# Patient Record
Sex: Male | Born: 1985 | Race: White | Hispanic: No | Marital: Married | Smoking: Former smoker
Health system: Southern US, Community
[De-identification: ages and names within clinical notes are randomized; demographics above are authoritative.]

## PROBLEM LIST (undated history)

## (undated) DIAGNOSIS — I10 Essential (primary) hypertension: Secondary | ICD-10-CM

## (undated) DIAGNOSIS — E782 Mixed hyperlipidemia: Secondary | ICD-10-CM

## (undated) DIAGNOSIS — F32A Depression, unspecified: Secondary | ICD-10-CM

## (undated) DIAGNOSIS — G4730 Sleep apnea, unspecified: Secondary | ICD-10-CM

## (undated) DIAGNOSIS — F419 Anxiety disorder, unspecified: Secondary | ICD-10-CM

## (undated) DIAGNOSIS — K219 Gastro-esophageal reflux disease without esophagitis: Secondary | ICD-10-CM

## (undated) DIAGNOSIS — E559 Vitamin D deficiency, unspecified: Secondary | ICD-10-CM

## (undated) DIAGNOSIS — T7840XA Allergy, unspecified, initial encounter: Secondary | ICD-10-CM

## (undated) DIAGNOSIS — E785 Hyperlipidemia, unspecified: Secondary | ICD-10-CM

## (undated) HISTORY — DX: Anxiety disorder, unspecified: F41.9

## (undated) HISTORY — PX: HERNIA REPAIR: SHX51

## (undated) HISTORY — DX: Mixed hyperlipidemia: E78.2

## (undated) HISTORY — DX: Sleep apnea, unspecified: G47.30

## (undated) HISTORY — DX: Gastro-esophageal reflux disease without esophagitis: K21.9

## (undated) HISTORY — DX: Allergy, unspecified, initial encounter: T78.40XA

## (undated) HISTORY — DX: Hyperlipidemia, unspecified: E78.5

## (undated) HISTORY — DX: Essential (primary) hypertension: I10

## (undated) HISTORY — DX: Depression, unspecified: F32.A

## (undated) HISTORY — DX: Vitamin D deficiency, unspecified: E55.9

---

## 2006-01-02 HISTORY — PX: CHOLECYSTECTOMY: SHX55

## 2006-01-05 ENCOUNTER — Emergency Department (HOSPITAL_COMMUNITY): Admission: EM | Admit: 2006-01-05 | Discharge: 2006-01-05 | Payer: Self-pay | Admitting: Emergency Medicine

## 2007-10-05 ENCOUNTER — Emergency Department (HOSPITAL_COMMUNITY): Admission: EM | Admit: 2007-10-05 | Discharge: 2007-10-05 | Payer: Self-pay | Admitting: Emergency Medicine

## 2008-05-24 ENCOUNTER — Emergency Department (HOSPITAL_COMMUNITY): Admission: EM | Admit: 2008-05-24 | Discharge: 2008-05-24 | Payer: Self-pay | Admitting: Family Medicine

## 2008-05-27 ENCOUNTER — Emergency Department (HOSPITAL_COMMUNITY): Admission: EM | Admit: 2008-05-27 | Discharge: 2008-05-27 | Payer: Self-pay | Admitting: Family Medicine

## 2009-03-01 ENCOUNTER — Emergency Department (HOSPITAL_COMMUNITY): Admission: EM | Admit: 2009-03-01 | Discharge: 2009-03-01 | Payer: Self-pay | Admitting: Family Medicine

## 2009-03-04 ENCOUNTER — Emergency Department (HOSPITAL_COMMUNITY): Admission: EM | Admit: 2009-03-04 | Discharge: 2009-03-04 | Payer: Self-pay | Admitting: Family Medicine

## 2009-04-27 ENCOUNTER — Emergency Department (HOSPITAL_COMMUNITY): Admission: EM | Admit: 2009-04-27 | Discharge: 2009-04-27 | Payer: Self-pay | Admitting: Family Medicine

## 2010-03-23 LAB — POCT RAPID STREP A (OFFICE): Streptococcus, Group A Screen (Direct): NEGATIVE

## 2010-04-12 LAB — POCT RAPID STREP A (OFFICE): Streptococcus, Group A Screen (Direct): NEGATIVE

## 2010-06-20 IMAGING — CR DG CHEST 2V
4 series · 4 of 4 positions shown · non-contrast
Comparison: None

CLINICAL DATA: Cough and cold symptoms

CHEST - 2 VIEW

[view not recorded (1 of 4)]
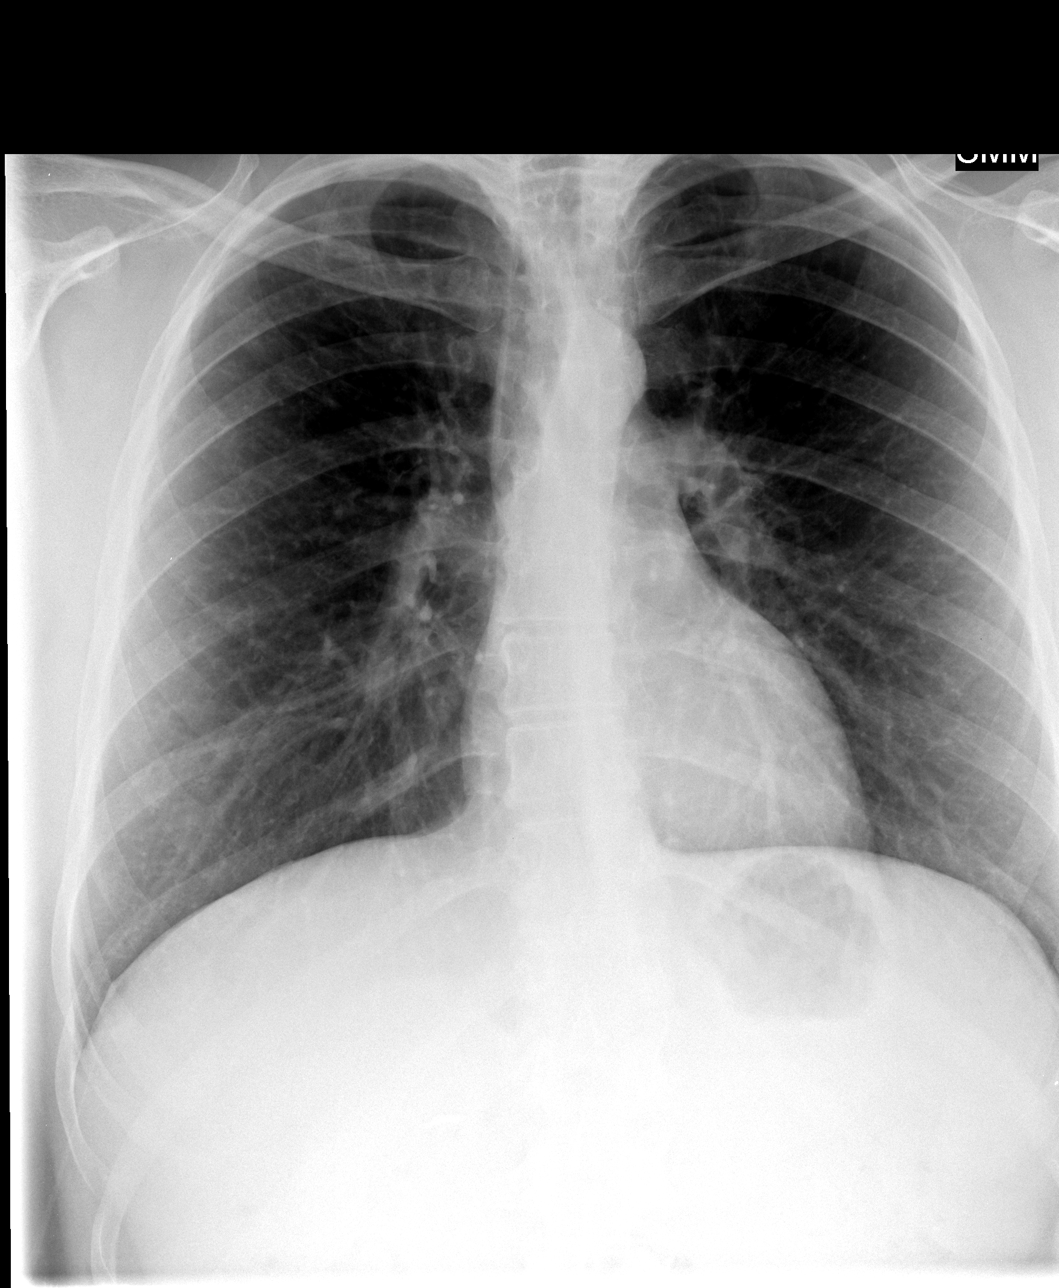

[view not recorded (2 of 4)]
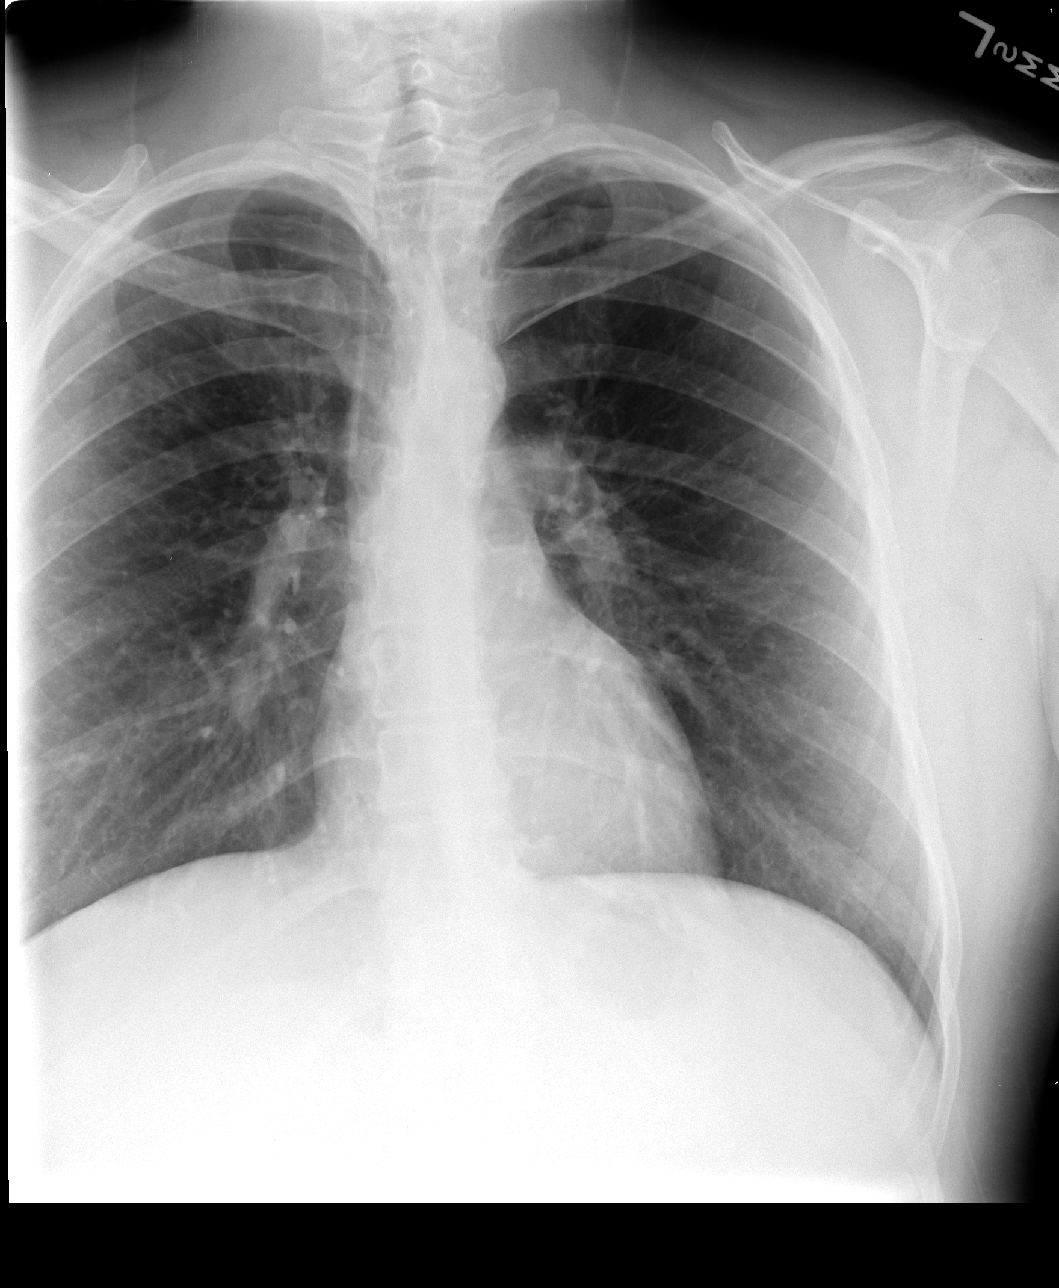

[view not recorded (3 of 4)]
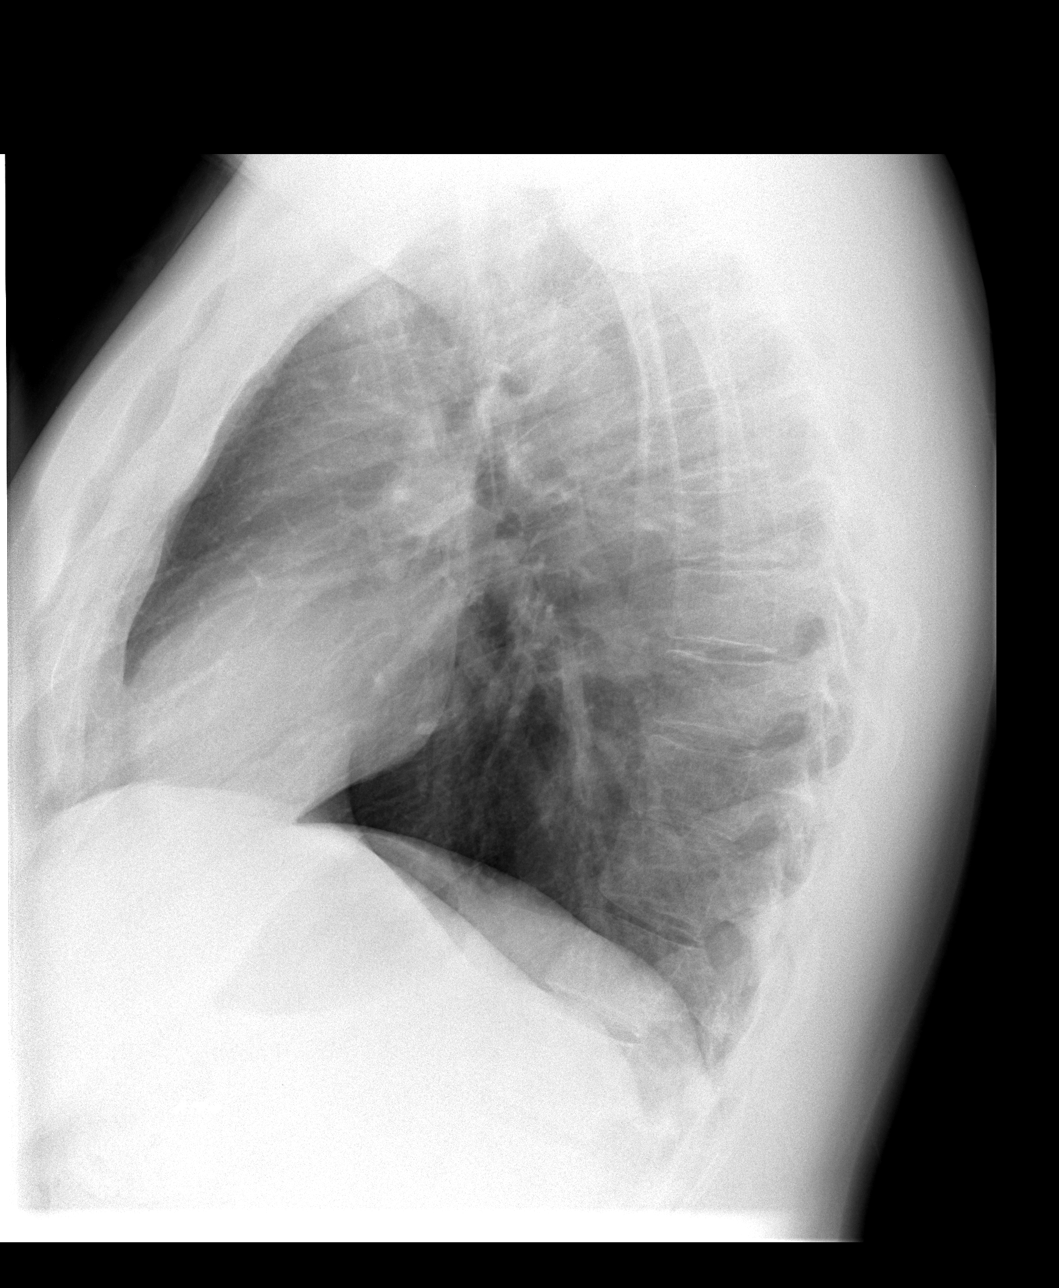

[view not recorded (4 of 4)]
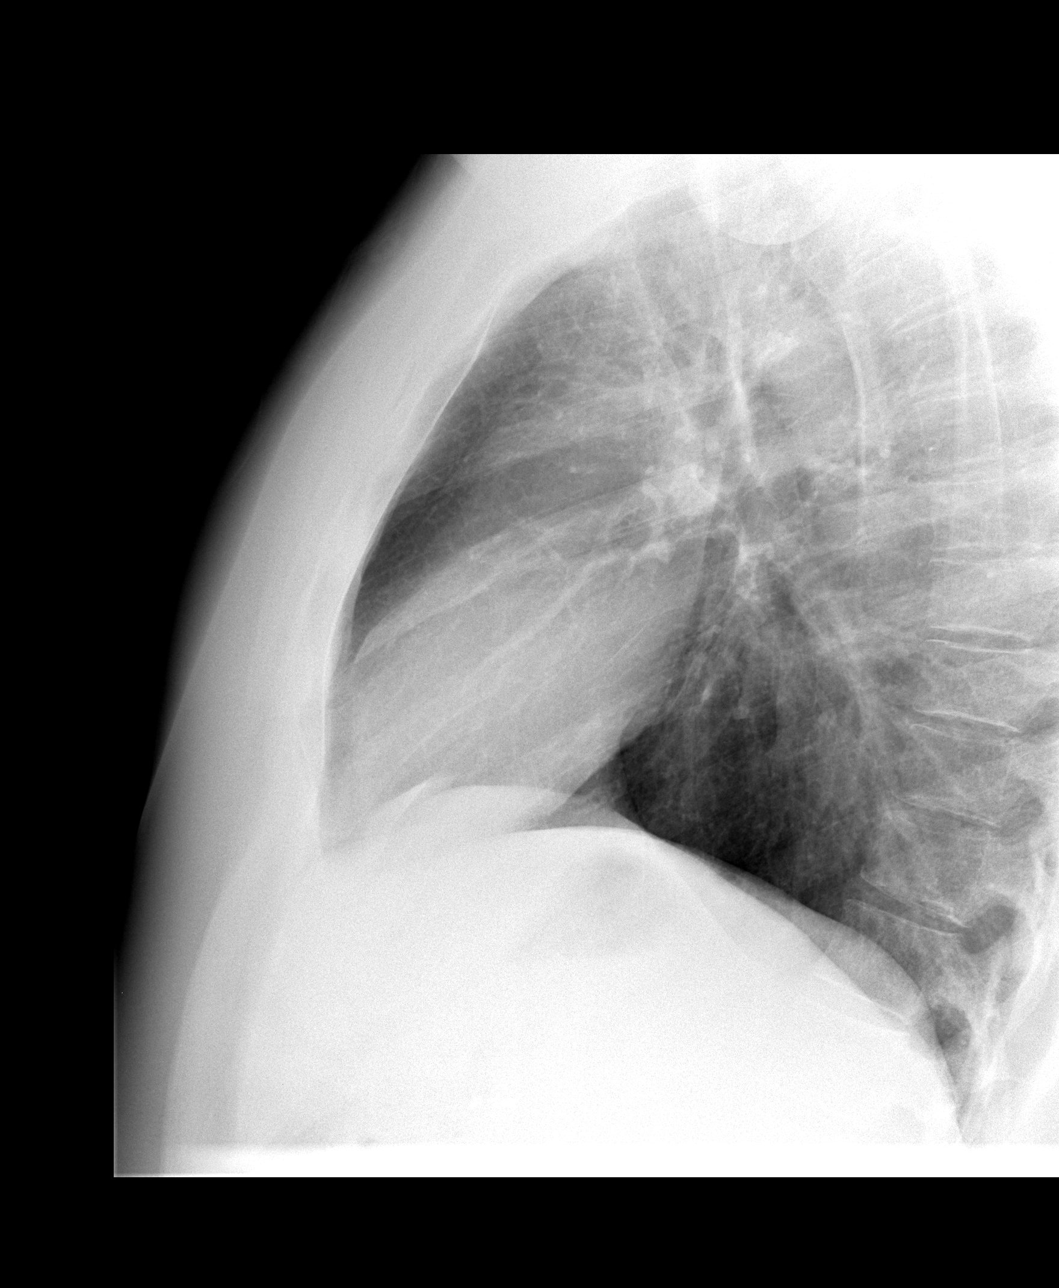

[4 of 4 positions shown; findings below may reference images not displayed]

FINDINGS: Normal mediastinum and cardiac silhouette.  Costophrenic
angles are clear.  No evidence effusion, infiltrate, or
pneumothorax.  There is scoliosis of the spine.  Cholecystectomy
clips in the right upper quadrant.
IMPRESSION: 1.  No acute cardiopulmonary process.
2.  Scoliosis.

## 2010-06-28 ENCOUNTER — Encounter: Payer: Self-pay | Admitting: Internal Medicine

## 2010-06-29 NOTE — Progress Notes (Signed)
This encounter was created in error - please disregard.

## 2010-07-22 ENCOUNTER — Other Ambulatory Visit (INDEPENDENT_AMBULATORY_CARE_PROVIDER_SITE_OTHER): Payer: 59

## 2010-07-22 ENCOUNTER — Encounter: Payer: Self-pay | Admitting: Internal Medicine

## 2010-07-22 ENCOUNTER — Ambulatory Visit (INDEPENDENT_AMBULATORY_CARE_PROVIDER_SITE_OTHER): Payer: 59 | Admitting: Internal Medicine

## 2010-07-22 VITALS — BP 108/76 | HR 80 | Ht 73.0 in | Wt 227.2 lb

## 2010-07-22 DIAGNOSIS — R1011 Right upper quadrant pain: Secondary | ICD-10-CM

## 2010-07-22 DIAGNOSIS — E669 Obesity, unspecified: Secondary | ICD-10-CM

## 2010-07-22 DIAGNOSIS — R748 Abnormal levels of other serum enzymes: Secondary | ICD-10-CM

## 2010-07-22 LAB — HEPATIC FUNCTION PANEL
ALT: 104 U/L — ABNORMAL HIGH (ref 0–53)
AST: 146 U/L — ABNORMAL HIGH (ref 0–37)
Albumin: 4.4 g/dL (ref 3.5–5.2)
Alkaline Phosphatase: 101 U/L (ref 39–117)
Bilirubin, Direct: 0.5 mg/dL — ABNORMAL HIGH (ref 0.0–0.3)
Total Bilirubin: 2 mg/dL — ABNORMAL HIGH (ref 0.3–1.2)
Total Protein: 7.9 g/dL (ref 6.0–8.3)

## 2010-07-22 NOTE — Assessment & Plan Note (Signed)
Right upper quadrant pain etiology unclear. Could be reflux, sounds pleuritic in origin and not biliary but may need further imaging depending upon the clinical course.

## 2010-07-22 NOTE — Patient Instructions (Addendum)
Please go to the basement upon leaving today to have your labs done.  Fatty Liver Hepatosteatosis, Steatohepatitis Fatty liver is the accumulation of fat in liver cells. It is also called hepatosteatosis or steatohepatitis. It is normal for your liver to contain some fat. If fat is more than 5-10% of your liver's weight, you have fatty liver.  There are often no symptoms (problems) for years while damage is still occurring. People often learn about their fatty liver when they have medical tests for other reasons. Fat can damage your liver for years or even decades without causing problems. When it becomes severe, it can cause fatigue, weight loss, weakness, and confusion. This makes you more likely to develop more serious liver problems. The liver is the largest organ in the body. It does a lot of work and often gives no warning signs when it is sick until late in a disease. The liver has many important jobs including:  Breaking down foods.   Storing vitamins, iron, and other minerals.   Making proteins.   Making bile for food digestion.   Breaking down many products including medications, alcohol and some poisons.  CAUSES There are a number of different conditions, medications, and poisons that can cause a fatty liver. Eating too many calories causes fat to build up in the liver. Not processing and breaking fats down normally may also cause this. Certain conditions, such as obesity, diabetes, and high triglycerides also cause this. Most fatty liver patients tend to be middle-aged and over weight.  Some causes of fatty liver are:  Alcohol over consumption.  Malnutrition.   Steroid use.   Valproic acid toxicity.   Obesity.  Cushing's syndrome.   Poisons.   Tetracycline in high dosages.   Pregnancy.  Diabetes.   Hyperlipidemia.   Rapid weight loss.   Some people develop fatty liver even having none of these conditions. SYMPTOMS Fatty liver most often causes no problems.  This is called asymptomatic.  It can be diagnosed with blood tests and also by a liver biopsy.   It is one of the most common causes of minor elevations of liver enzymes on routine blood tests.   Specialized Imaging of the liver using ultrasound, CT (computed tomography) scan, or MRI (magnetic resonance imaging) can suggest a fatty liver but a biopsy is needed to confirm it.   A biopsy involves taking a small sample of liver tissue. This is done by using a needle. It is then looked at under a microscope by a specialist.  TREATMENT  It is important to treat the cause. Simple fatty liver without a medical reason may not need treatment.  Weight loss, fat restriction, and exercise in overweight patients produces inconsistent results but is worth trying.   Fatty liver due to alcohol toxicity may not improve even with stopping drinking.   Good control of diabetes may reduce fatty liver.   Lower your triglycerides through diet, medication or both.   Eat a balanced, healthy diet.   Increase your physical activity.   Get regular checkups from a liver specialist.   There are no medical or surgical treatments for a fatty liver or NASH, but improving your diet and increasing your exercise may help prevent or reverse some of the damage.  PROGNOSIS Fatty liver may cause no damage or it can lead to an inflammation of the liver. This is, called steatohepatitis. When it is linked to alcohol abuse, it is called alcoholic steatohepatitis. It often is not linked to alcohol.  It is then called nonalcoholic steatohepatitis, or NASH. Over time the liver may become scarred and hardened. This condition is called cirrhosis. Cirrhosis is serious and may lead to liver failure or cancer. NASH is one of the leading causes of cirrhosis. About 10-20% of Americans have fatty liver and a smaller 2-5% has NASH. Much of this information is from the [employer]. Last reviewed by [facility] 02-02-05 Document  Released: 02/03/2005 Document Re-Released: 03/17/2008 [facility] Patient Information 2011 [place], [place].  Obesity Obesity is defined as having a Body Mass Index (BMI) of 30 or more. To calculate your BMI divide your weight in pounds by your height in inches squared and multiply that product by 703. Major illnesses resulting from long-term obesity include:  Stroke.   Heart disease.   Diabetes.   Many cancers.   Arthritis.  Obesity also complicates recovery from many other medical problems.  CAUSES  A history of obesity in your parents.   Thyroid hormone imbalance.   Environmental factors such as excess calorie intake and physical inactivity.  TREATMENT A healthy weight loss program includes:  A calorie restricted diet based on individual calorie needs.   Increased physical activity (exercise).  An exercise program is just as important as the right low calorie diet.  Weight-loss medicines should be used only under the supervision of your physician. These medicines help, but only if they are used with diet and exercise programs. Medicines can have side effects including nervousness, nausea, abdominal pain, diarrhea, headache, drowsiness, and depression.  An unhealthy weight loss program includes:  Fasting.   Fad diets.   Supplements and drugs.  These choices do not succeed in long-term weight control.  HOME CARE INSTRUCTIONS To help you make the needed dietary changes:   Keep a daily record of everything you eat. There are many free websites to help you with this. It may be helpful to measure your foods so you can determine if you are eating the correct portion sizes.   Use low-calorie cookbooks or take special cooking classes.   Avoid alcohol. Drink more water and drinks with no calories.   Take vitamins and supplements only as recommended by your caregiver.   Weight-loss support groups, Optometrist, counselors, and stress reduction education can also be very  helpful.  PREVENTION Losing weight and keeping it off takes time, discipline, a healthy diet and regular exercise. Document Released: 01/27/2004 Document Re-Released: 01/08/2007 [facility] Patient Information 2011 [place], [place].

## 2010-07-22 NOTE — Assessment & Plan Note (Signed)
We discussed this. He gained 10 pounds while on Celexa so that could in part the cause. He needs to lose weight we set a goal of losing below 200 pounds in one to 2 years hopefully in a year. At some point as his stress level improves he may be able to come off Celexa. I advised him to discuss that with his PCP when ready.

## 2010-07-22 NOTE — Progress Notes (Signed)
  Subjective:    Patient ID: Peter Le, male    DOB: 1985/09/13, 25 y.o.   MRN: [id number]  HPI Intermittent RUQ pain, crampy and once that dissipates once a belch occurs. Feels like the pain he had prior to cholecystectomy for gallstones in 2008. Pain may be severe, last for up to 5-10 mins. Does not disturb sleep. Frequency is variable, most frequent is 1-2 x a week. Usually occurs in AM before he eats. Rare alcohol before but none since the abnormal tests. LFT's normal before gallbladder removed. Abnormal LFT's noticed 3-4 months ago. Otherwise ok as long as takes omeprazole for reflux. He had hepatitis B vaccine in middle school. He has no history of blood transfusions or needle use. There are no other high-risk behaviors he did have repeat labs in June, I do not have those to review at this point. Denies any myalgias or muscle cramps.    Review of Systems + fatigue, not getting 8 hours sleep, ongoing allergies, takes antihistamine and pseudofed    Objective:   Physical Exam  Constitutional: He appears well-developed and well-nourished.       Overweight  Eyes: Conjunctivae are normal. No scleral icterus.  Neck: Normal range of motion. Neck supple. No JVD present. No thyromegaly present.  Cardiovascular: Normal rate, regular rhythm and normal heart sounds.   Pulmonary/Chest: Effort normal and breath sounds normal.  Abdominal: Soft. Bowel sounds are normal. He exhibits no distension and no mass. There is no rebound and no guarding.       No hepatosplenomegaly. No abnormalities of the abdominal skin  Musculoskeletal: He exhibits no edema.  Lymphadenopathy:    He has no cervical adenopathy.  Skin: Skin is warm and dry.       No stigmata of chronic liver disease. No spider telangiectasia.  Psychiatric: He has a normal mood and affect. His behavior is normal.          Assessment & Plan:

## 2010-07-22 NOTE — Assessment & Plan Note (Signed)
AST 83 ALT 126 on 03/23/2010. Alkaline phosphatase is 124. I don't have the followup labs from June we will review those. He does not have hepatitis A B. or C., he is immune to hepatitis B. Could be fatty liver, he does have mild elevation of triglycerides and low HDL and is overweight to obese so he could have metabolic syndrome. Statistically the most likely problem. Await those other lab tests and we'll determine further serologic workup after that. He does have some right upper quadrant pain, it doesn't sound biliary in origin but we'll keep that in mind. He may need imaging, a retained common duct stone would be possible but seems unlikely to me given the overall scenario.

## 2010-07-27 ENCOUNTER — Encounter: Payer: Self-pay | Admitting: Internal Medicine

## 2010-07-27 ENCOUNTER — Telehealth: Payer: Self-pay

## 2010-07-27 NOTE — Telephone Encounter (Signed)
I have left a voicemail for the patient to call back

## 2010-07-27 NOTE — Telephone Encounter (Signed)
Message copied by Annett Fabian on Wed Jul 27, 2010 11:03 AM ------      Message from: Stan Head E      Created: Wed Jul 27, 2010 10:41 AM       Liver tests are still elevated.      Needs further blood work to evaluate for cause.            1) Fasting ferritin, ANA, ceruloplasmin, SPEP, anti-smooth muscle antibody, IgA level, TTG ab. Use 790.4 diagnosis

## 2010-07-27 NOTE — Progress Notes (Signed)
Quick Note:  Liver tests are still elevated. Needs further blood work to evaluate for cause.  1) Fasting ferritin, ANA, ceruloplasmin, SPEP, anti-smooth muscle antibody, IgA level, TTG ab. Use 790.4 diagnosis ______

## 2010-07-29 NOTE — Telephone Encounter (Signed)
Left message for patient to call back  

## 2010-08-01 NOTE — Telephone Encounter (Signed)
Multiple attempts have been made to reach the patient.  I have left another voicemail and sent a letter

## 2014-07-23 ENCOUNTER — Ambulatory Visit (INDEPENDENT_AMBULATORY_CARE_PROVIDER_SITE_OTHER): Payer: 59 | Admitting: Internal Medicine

## 2014-07-23 ENCOUNTER — Encounter: Payer: Self-pay | Admitting: Internal Medicine

## 2014-07-23 VITALS — BP 116/76 | HR 92 | Temp 98.2°F | Resp 18 | Ht 73.0 in | Wt 217.0 lb

## 2014-07-23 DIAGNOSIS — J069 Acute upper respiratory infection, unspecified: Secondary | ICD-10-CM

## 2014-07-23 MED ORDER — BENZONATATE 200 MG PO CAPS: 200.0000 mg | ORAL_CAPSULE | Freq: Three times a day (TID) | ORAL | Status: DC | PRN

## 2014-07-23 MED ORDER — AZITHROMYCIN 250 MG PO TABS: ORAL_TABLET | ORAL | Status: DC

## 2014-07-23 MED ORDER — PREDNISONE 20 MG PO TABS: ORAL_TABLET | ORAL | Status: DC

## 2014-07-23 NOTE — Patient Instructions (Signed)
Upper Respiratory Infection, Adult An upper respiratory infection (URI) is also sometimes known as the common cold. The upper respiratory tract includes the nose, sinuses, throat, trachea, and bronchi. Bronchi are the airways leading to the lungs. Most people improve within 1 week, but symptoms can last up to 2 weeks. A residual cough may last even longer.  CAUSES Many different viruses can infect the tissues lining the upper respiratory tract. The tissues become irritated and inflamed and often become very moist. Mucus production is also common. A cold is contagious. You can easily spread the virus to others by oral contact. This includes kissing, sharing a glass, coughing, or sneezing. Touching your mouth or nose and then touching a surface, which is then touched by another person, can also spread the virus. SYMPTOMS  Symptoms typically develop 1 to 3 days after you come in contact with a cold virus. Symptoms vary from person to person. They may include:  Runny nose.  Sneezing.  Nasal congestion.  Sinus irritation.  Sore throat.  Loss of voice (laryngitis).  Cough.  Fatigue.  Muscle aches.  Loss of appetite.  Headache.  Low-grade fever. DIAGNOSIS  You might diagnose your own cold based on familiar symptoms, since most people get a cold 2 to 3 times a year. Your caregiver can confirm this based on your exam. Most importantly, your caregiver can check that your symptoms are not due to another disease such as strep throat, sinusitis, pneumonia, asthma, or epiglottitis. Blood tests, throat tests, and X-rays are not necessary to diagnose a common cold, but they may sometimes be helpful in excluding other more serious diseases. Your caregiver will decide if any further tests are required. RISKS AND COMPLICATIONS  You may be at risk for a more severe case of the common cold if you smoke cigarettes, have chronic heart disease (such as heart failure) or lung disease (such as asthma), or if  you have a weakened immune system. The very young and very old are also at risk for more serious infections. Bacterial sinusitis, middle ear infections, and bacterial pneumonia can complicate the common cold. The common cold can worsen asthma and chronic obstructive pulmonary disease (COPD). Sometimes, these complications can require emergency medical care and may be life-threatening. PREVENTION  The best way to protect against getting a cold is to practice good hygiene. Avoid oral or hand contact with people with cold symptoms. Wash your hands often if contact occurs. There is no clear evidence that vitamin C, vitamin E, echinacea, or exercise reduces the chance of developing a cold. However, it is always recommended to get plenty of rest and practice good nutrition. TREATMENT  Treatment is directed at relieving symptoms. There is no cure. Antibiotics are not effective, because the infection is caused by a virus, not by bacteria. Treatment may include:  Increased fluid intake. Sports drinks offer valuable electrolytes, sugars, and fluids.  Breathing heated mist or steam (vaporizer or shower).  Eating chicken soup or other clear broths, and maintaining good nutrition.  Getting plenty of rest.  Using gargles or lozenges for comfort.  Controlling fevers with ibuprofen or acetaminophen as directed by your caregiver.  Increasing usage of your inhaler if you have asthma. Zinc gel and zinc lozenges, taken in the first 24 hours of the common cold, can shorten the duration and lessen the severity of symptoms. Pain medicines may help with fever, muscle aches, and throat pain. A variety of non-prescription medicines are available to treat congestion and runny nose. Your caregiver   can make recommendations and may suggest nasal or lung inhalers for other symptoms.  HOME CARE INSTRUCTIONS   Only take over-the-counter or prescription medicines for pain, discomfort, or fever as directed by your  caregiver.  Use a warm mist humidifier or inhale steam from a shower to increase air moisture. This may keep secretions moist and make it easier to breathe.  Drink enough water and fluids to keep your urine clear or pale yellow.  Rest as needed.  Return to work when your temperature has returned to normal or as your caregiver advises. You may need to stay home longer to avoid infecting others. You can also use a face mask and careful hand washing to prevent spread of the virus. SEEK MEDICAL CARE IF:   After the first few days, you feel you are getting worse rather than better.  You need your caregiver's advice about medicines to control symptoms.  You develop chills, worsening shortness of breath, or brown or red sputum. These may be signs of pneumonia.  You develop yellow or brown nasal discharge or pain in the face, especially when you bend forward. These may be signs of sinusitis.  You develop a fever, swollen neck glands, pain with swallowing, or white areas in the back of your throat. These may be signs of strep throat. SEEK IMMEDIATE MEDICAL CARE IF:   You have a fever.  You develop severe or persistent headache, ear pain, sinus pain, or chest pain.  You develop wheezing, a prolonged cough, cough up blood, or have a change in your usual mucus (if you have chronic lung disease).  You develop sore muscles or a stiff neck. Document Released: 06/14/2000 Document Revised: 03/13/2011 Document Reviewed: 03/26/2013 [employer] Patient Information 2015 [employer]. This information is not intended to replace advice given to you by your health care provider. Make sure you discuss any questions you have with your health care provider.  

## 2014-07-23 NOTE — Progress Notes (Signed)
Patient ID: Peter Le, male   DOB: 09/20/1985, 29 y.o.   MRN: [id number]  HPI  Patient presents to the office for evaluation of cough.  It has been going on for 5 days.  Patient reports all the time, dry.  They also endorse postnasal drip and sinus pressure and congestion, sore throat, ear congestion.  .  They have tried claritin.  They report that nothing has worked.  They admits to other sick contacts.  Patient reports that his daughter was sick approximately 1 week.  Daughter had "sinus infection, double ear infection and eye infection."  Review of Systems  Constitutional: Positive for malaise/fatigue. Negative for fever and chills.  HENT: Positive for congestion and sore throat. Negative for ear discharge, ear pain and tinnitus.   Eyes: Negative.   Respiratory: Positive for cough. Negative for sputum production, shortness of breath and wheezing.   Cardiovascular: Negative for chest pain, palpitations and leg swelling.  Neurological: Positive for headaches.    PE:  General:  Alert and non-toxic, WDWN, NAD HEENT: NCAT, PERLA, EOM normal, no occular discharge or erythema.  Nasal mucosal edema with sinus tenderness to palpation.  Oropharynx clear with minimal oropharyngeal edema and erythema.  Mucous membranes moist and pink. Neck:  Cervical adenopathy Chest:  RRR no MRGs.  Lungs clear to auscultation A&P with no wheezes rhonchi or rales.   Abdomen: +BS x 4 quadrants, soft, non-tender, no guarding, rigidity, or rebound. Skin: warm and dry no rash Neuro: A&Ox4, CN II-XII grossly intact  Assessment and Plan:   1. Acute URI -zyrtec -nasal saline -mucinex prn - predniSONE (DELTASONE) 20 MG tablet; 3 tabs po day one, then 2 tabs daily x 4 days  Dispense: 11 tablet; Refill: 0 - azithromycin (ZITHROMAX Z-PAK) 250 MG tablet; 2 po day one, then 1 daily x 4 days  Dispense: 5 tablet; Refill: 0 - benzonatate (TESSALON) 200 MG capsule; Take 1 capsule (200 mg total) by mouth 3 (three) times  daily as needed for cough.  Dispense: 30 capsule; Refill: 1

## 2017-09-10 ENCOUNTER — Encounter: Payer: Self-pay | Admitting: Internal Medicine

## 2017-09-10 ENCOUNTER — Ambulatory Visit (INDEPENDENT_AMBULATORY_CARE_PROVIDER_SITE_OTHER): Payer: 59 | Admitting: Internal Medicine

## 2017-09-10 VITALS — BP 134/100 | HR 96 | Temp 97.2°F | Resp 18 | Ht 73.0 in | Wt 234.6 lb

## 2017-09-10 DIAGNOSIS — J40 Bronchitis, not specified as acute or chronic: Secondary | ICD-10-CM

## 2017-09-10 DIAGNOSIS — R0989 Other specified symptoms and signs involving the circulatory and respiratory systems: Secondary | ICD-10-CM

## 2017-09-10 DIAGNOSIS — K219 Gastro-esophageal reflux disease without esophagitis: Secondary | ICD-10-CM | POA: Diagnosis not present

## 2017-09-10 MED ORDER — PREDNISONE 20 MG PO TABS
ORAL_TABLET | ORAL | 0 refills | Status: DC
Start: 2017-09-10 — End: 2018-02-20

## 2017-09-10 MED ORDER — OMEPRAZOLE 40 MG PO CPDR: DELAYED_RELEASE_CAPSULE | ORAL | 1 refills | Status: DC

## 2017-09-10 MED ORDER — AZITHROMYCIN 250 MG PO TABS: ORAL_TABLET | ORAL | 1 refills | Status: DC

## 2017-09-10 MED ORDER — BENZONATATE 200 MG PO CAPS: ORAL_CAPSULE | ORAL | 1 refills | Status: DC

## 2017-09-10 NOTE — Patient Instructions (Addendum)

## 2017-09-10 NOTE — Progress Notes (Signed)
This very nice 32 y.o. MWM with hx/o labile HTN, HLD, GERD & Vit D Deficiency   presents for 3 day hx/o head & chest congestion with a productive cough. Denies fevers, chills, sweats, dyspnea or rash.     Patient has been treated in the past w/Ziac , but has been lost to f/u over 3 years. Today's BP is elevated at 134/100 & rechecked x 2. . Patient has had no cardiac type chest pain, palpitations, dyspnea / orthopnea / PND, dizziness, claudication, or dependent edema.     Patient has hx/o High Trig's with Cholesterols at goal.     Also, the patient has history of Nl A1c in 2012 with an elevated Insulin level 121.  He has had no symptoms of reactive hypoglycemia, diabetic polys, paresthesias or visual blurring.      Further, the patient also has history of Vitamin D Deficiency ("18"/2012).  Current Outpatient Medications on File Prior to Visit  Medication Sig  . OTC Allegra Takes PRN for allergies.  . ranitidine  150 MG tablet Take 450 mg by mouth 2 (two) times daily.   No Known Allergies   PMHx:   Past Medical History:  Diagnosis Date  . Anxiety   . GERD (gastroesophageal reflux disease)   . HTN (hypertension)   . Mixed hyperlipidemia   . Unspecified vitamin D deficiency    Immunization History  Administered Date(s) Administered  . Influenza Inj Mdck Quad Pf 11/15/2016   Past Surgical History:  Procedure Laterality Date  . CHOLECYSTECTOMY  2008   gallstones  . HERNIA REPAIR  age 29   FHx:    Reviewed / unchanged  SHx:    Reviewed / unchanged   Systems Review:  Constitutional: Denies fever, chills, wt changes, headaches, insomnia, fatigue, night sweats, change in appetite. Eyes: Denies redness, blurred vision, diplopia, discharge, itchy, watery eyes.  ENT: Denies discharge, congestion, post nasal drip, epistaxis, sore throat, earache, hearing loss, dental pain, tinnitus, vertigo, sinus pain, snoring.  CV: Denies chest pain, palpitations, irregular heartbeat, syncope,  dyspnea, diaphoresis, orthopnea, PND, claudication or edema. Respiratory: (+) cough,  No dyspnea, DOE, pleurisy, hoarseness, laryngitis, wheezing.  Gastrointestinal: Denies dysphagia, odynophagia, heartburn, reflux, water brash, abdominal pain or cramps, nausea, vomiting, bloating, diarrhea, constipation, hematemesis, melena, hematochezia  or hemorrhoids. Genitourinary: Denies dysuria, frequency, urgency, nocturia, hesitancy, discharge, hematuria or flank pain. Musculoskeletal: Denies arthralgias, myalgias, stiffness, jt. swelling, pain, limping or strain/sprain.  Skin: Denies pruritus, rash, hives, warts, acne, eczema or change in skin lesion(s). Neuro: No weakness, tremor, incoordination, spasms, paresthesia or pain. Psychiatric: Denies confusion, memory loss or sensory loss. Endo: Denies change in weight, skin or hair change.  Heme/Lymph: No excessive bleeding, bruising or enlarged lymph nodes.  Physical Exam  BP (!) 134/100   Pulse 96   Temp (!) 97.2 F (36.2 C)   Resp 18   Ht 6\' 1"  (1.854 m)   Wt 234 lb 9.6 oz (106.4 kg)   BMI 30.95 kg/m   Appears  well nourished, well groomed  and in no distress.  Eyes: PERRLA, EOMs, conjunctiva no swelling or erythema. Sinuses: No frontal/maxillary tenderness ENT/Mouth: EAC's clear, TM's nl w/o erythema, bulging. Nares clear w/o erythema, swelling, exudates. Oropharynx clear without erythema or exudates. Oral hygiene is good. Tongue normal, non obstructing. Hearing intact.  Neck: Supple. Thyroid not palpable. Car 2+/2+ without bruits, nodes or JVD. Chest:  Scattered dray rales and no  rhonchi, wheezing or stridor.  Cor: Heart sounds normal  w/ regular rate and rhythm without sig. murmurs, gallops, clicks or rubs. Peripheral pulses normal and equal  without edema.  Musculoskeletal: Full ROM all peripheral extremities, joint stability, 5/5 strength and normal gait.  Skin: Warm, dry without exposed rashes, lesions or ecchymosis apparent.  Neuro:  Cranial nerves intact, reflexes equal bilaterally. Sensory-motor testing grossly intact. Tendon reflexes grossly intact.  Pysch: Alert & oriented x 3.  Insight and judgement nl & appropriate. No ideations.  Assessment and Plan:  1. Tracheobronchitis  - predniSONE (DELTASONE) 20 MG tablet; 1 tab 3 x day for 3 days, then 1 tab 2 x day for 3 days, then 1 tab 1 x day for 5 days  Dispense: 20 tablet   - azithromycin (ZITHROMAX) 250 MG tablet; Take 2 tablets (500 mg) on  Day 1,  followed by 1 tablet (250 mg) once daily on Days 2 through 5.  Dispense: 6 each; Refill: 1  - benzonatate (TESSALON) 200 MG capsule; Take 1 perle 3 x / day to prevent cough  Dispense: 30 capsule; Refill: 1  2. Labile hypertension  - monitor blood pressure at home. & call if remain elevated >140/90  3. Gastroesophageal reflux disease  - omeprazole (PRILOSEC) 40 MG capsule; Take 1 capsule every night for Acid Reflux  Dispense: 90 capsule; Refill: 1 - and to taper the Ranitidine.      Discussed  regular exercise, BP monitoring, weight control to achieve/maintain BMI less than 25 and discussed med and SE's. Recommended labs to assess and monitor clinical status with further disposition pending results of labs. Over 30 minutes of exam, counseling, chart review was performed.

## 2018-02-20 ENCOUNTER — Encounter: Payer: Self-pay | Admitting: Physician Assistant

## 2018-02-20 ENCOUNTER — Ambulatory Visit: Payer: 59 | Admitting: Physician Assistant

## 2018-02-20 VITALS — BP 128/80 | HR 105 | Temp 97.5°F | Ht 73.0 in | Wt 235.2 lb

## 2018-02-20 DIAGNOSIS — J358 Other chronic diseases of tonsils and adenoids: Secondary | ICD-10-CM

## 2018-02-20 DIAGNOSIS — R000 Tachycardia, unspecified: Secondary | ICD-10-CM

## 2018-02-20 DIAGNOSIS — R531 Weakness: Secondary | ICD-10-CM | POA: Diagnosis not present

## 2018-02-20 NOTE — Patient Instructions (Signed)
We have made a referral for you to get imaging or go to another doctor. We will try to get this as soon as possible but please understand that we often need to get approval with your insurance before we can schedule and not every office can accommodate you quickly. So please allow 5-7 business days for [place] to call you back about the referral.   Palpitations Palpitations are feelings that your heartbeat is irregular or is faster than normal. It may feel like your heart is fluttering or skipping a beat. Palpitations are usually not a serious problem. They may be caused by many things, including smoking, caffeine, alcohol, stress, and certain medicines or drugs. Most causes of palpitations are not serious. However, some palpitations can be a sign of a serious problem. You may need further tests to rule out serious medical problems. Follow these instructions at home:     Pay attention to any changes in your condition. Take these actions to help manage your symptoms: Eating and drinking  Avoid foods and drinks that may cause palpitations. These may include: ? Caffeinated coffee, tea, soft drinks, diet pills, and energy drinks. ? Chocolate. ? Alcohol. Lifestyle  Take steps to reduce your stress and anxiety. Things that can help you relax include: ? Yoga. ? Mind-body activities, such as deep breathing, meditation, or using words and images to create positive thoughts (guided imagery). ? Physical activity, such as swimming, jogging, or walking. Tell your health care provider if your palpitations increase with activity. If you have chest pain or shortness of breath with activity, do not continue the activity until you are seen by your health care provider. ? Biofeedback. This is a method that helps you learn to use your mind to control things in your body, such as your heartbeat.  Do not use drugs, including cocaine or ecstasy. Do not use marijuana.  Get plenty of rest and sleep. Keep a regular bed  time. General instructions  Take over-the-counter and prescription medicines only as told by your health care provider.  Do not use any products that contain nicotine or tobacco, such as cigarettes and e-cigarettes. If you need help quitting, ask your health care provider.  Keep all follow-up visits as told by your health care provider. This is important. These may include visits for further testing if palpitations do not go away or get worse. Contact a health care provider if you:  Continue to have a fast or irregular heartbeat after 24 hours.  Notice that your palpitations occur more often. Get help right away if you:  Have chest pain or shortness of breath.  Have a severe headache.  Feel dizzy or you faint. Summary  Palpitations are feelings that your heartbeat is irregular or is faster than normal. It may feel like your heart is fluttering or skipping a beat.  Palpitations may be caused by many things, including smoking, caffeine, alcohol, stress, certain medicines, and drugs.  Although most causes of palpitations are not serious, some causes can be a sign of a serious medical problem.  Get help right away if you faint or have chest pain, shortness of breath, a severe headache, or dizziness. This information is not intended to replace advice given to you by your health care provider. Make sure you discuss any questions you have with your health care provider. Document Released: 12/17/1999 Document Revised: 01/31/2017 Document Reviewed: 01/31/2017 Elsevier Interactive Patient Education  2019 [employer].

## 2018-02-20 NOTE — Progress Notes (Signed)
Subjective:    Patient ID: Peter Le, male    DOB: 1985/08/08, 33 y.o.   MRN: [id number]  HPI 33 y.o. WM states he has pain in his left tonsil.  States he has had tonsil stones in the past. Last night felt irritated like a tonsil stone but this AM looks like "blood clot" attached to tonsil. Throat is uncomfortable, no fever, chills, he has had a lot of sinus drainage.  He is on equate cold and flu only when sinus drainage is bad, last took 1 week ago. No on nasal spray or allergy pill.   He has had 2 episodes of feeling faint/weak during the last 2 months, last one 3 weeks ago, no palpitations, nausea, sweating, SOB, CP. He was at work, works for [employer], stressful. Just as the morning went on, felt funny. Ate cereal that morning, lasted half day.   Blood pressure 128/80, pulse (!) 105, temperature (!) 97.5 F (36.4 C), height 6\' 1"  (1.854 m), weight 235 lb 3.2 oz (106.7 kg), SpO2 95 %.  Medications Current Outpatient Medications on File Prior to Visit  Medication Sig  . omeprazole (PRILOSEC) 40 MG capsule Take 1 capsule every night for Acid Reflux   No current facility-administered medications on file prior to visit.     Problem list He has Nonspecific elevation of levels of transaminase or lactic acid dehydrogenase (LDH); Other nonspecific abnormal serum enzyme levels; Right upper quadrant pain; Obesity; and Unconjugated hyperbilirubinemia on their problem list.   Review of Systems     Objective:   Physical Exam Constitutional:      General: He is not in acute distress.    Appearance: Normal appearance. He is not ill-appearing.  HENT:     Head: Normocephalic and atraumatic.     Right Ear: Ear canal and external ear normal. There is impacted cerumen.     Left Ear: Tympanic membrane, ear canal and external ear normal. There is no impacted cerumen.     Nose: Nose normal.     Mouth/Throat:     Mouth: Mucous membranes are moist.     Pharynx: Posterior  oropharyngeal erythema present. No oropharyngeal exudate.     Comments: Pedunculated purplish tag off the left tonsil, no tonsil stones, mild erythema, no other exudate. Normal tongue, uvula.  Eyes:     Pupils: Pupils are equal, round, and reactive to light.  Cardiovascular:     Rate and Rhythm: Regular rhythm. Tachycardia present.     Heart sounds: No murmur.  Pulmonary:     Effort: Pulmonary effort is normal.     Breath sounds: Normal breath sounds. No wheezing.  Musculoskeletal: Normal range of motion.  Skin:    General: Skin is warm and dry.     Findings: No rash.  Neurological:     General: No focal deficit present.     Mental Status: He is alert and oriented to person, place, and time.     Cranial Nerves: No cranial nerve deficit.       Assessment & Plan:   Tonsil stone -     Ambulatory referral to ENT - appears benign but patient states he can feel it, will refer for removal  Weakness Check labs EKG with sinus tacy No SOB, CP The patient was advised to call immediately if he has any concerning symptoms in the interval. The patient voices understanding of current treatment options and is in agreement with the current care plan.The patient knows to  call the clinic with any problems, questions or concerns or go to the ER if any further progression of symptoms.  -     CBC with Differential/Platelet -     COMPLETE METABOLIC PANEL WITH GFR -     TSH -     Magnesium -     Iron,Total/Total Iron Binding Cap  Tachycardia  check labs, r/u anemia, TSH, dehydration, electrolytes imbalance Increase water, decrease alcohol/caffeine, valsalva maneuvers taught EKG WNL, no ST changes -     CBC with Differential/Platelet -     COMPLETE METABOLIC PANEL WITH GFR -     TSH -     Magnesium -     EKG 12-Lead

## 2018-02-21 LAB — COMPLETE METABOLIC PANEL WITH GFR
AG Ratio: 2 (calc) (ref 1.0–2.5)
ALBUMIN MSPROF: 4.9 g/dL (ref 3.6–5.1)
ALKALINE PHOSPHATASE (APISO): 93 U/L (ref 36–130)
ALT: 73 U/L — ABNORMAL HIGH (ref 9–46)
AST: 37 U/L (ref 10–40)
BUN: 8 mg/dL (ref 7–25)
CALCIUM: 10.3 mg/dL (ref 8.6–10.3)
CO2: 30 mmol/L (ref 20–32)
CREATININE: 0.96 mg/dL (ref 0.60–1.35)
Chloride: 99 mmol/L (ref 98–110)
GFR, EST NON AFRICAN AMERICAN: 102 mL/min/{1.73_m2} (ref >=60)
GFR, Est African American: 118 mL/min/{1.73_m2} (ref >=60)
GLOBULIN: 2.5 g/dL (ref 1.9–3.7)
Glucose, Bld: 88 mg/dL (ref 65–99)
Potassium: 4.5 mmol/L (ref 3.5–5.3)
SODIUM: 138 mmol/L (ref 135–146)
Total Bilirubin: 1 mg/dL (ref 0.2–1.2)
Total Protein: 7.4 g/dL (ref 6.1–8.1)

## 2018-02-21 LAB — CBC WITH DIFFERENTIAL/PLATELET
ABSOLUTE MONOCYTES: 743 {cells}/uL (ref 200–950)
BASOS PCT: 0.4 %
Basophils Absolute: 32 cells/uL (ref 0–200)
Eosinophils Absolute: 71 cells/uL (ref 15–500)
Eosinophils Relative: 0.9 %
HEMATOCRIT: 44.5 % (ref 38.5–50.0)
HEMOGLOBIN: 15.9 g/dL (ref 13.2–17.1)
LYMPHS ABS: 1928 {cells}/uL (ref 850–3900)
MCH: 31.4 pg (ref 27.0–33.0)
MCHC: 35.7 g/dL (ref 32.0–36.0)
MCV: 87.9 fL (ref 80.0–100.0)
MPV: 12.4 fL (ref 7.5–12.5)
Monocytes Relative: 9.4 %
NEUTROS ABS: 5127 {cells}/uL (ref 1500–7800)
NEUTROS PCT: 64.9 %
Platelets: 248 10*3/uL (ref 140–400)
RBC: 5.06 10*6/uL (ref 4.20–5.80)
RDW: 12.5 % (ref 11.0–15.0)
Total Lymphocyte: 24.4 %
WBC: 7.9 10*3/uL (ref 3.8–10.8)

## 2018-02-21 LAB — MAGNESIUM: MAGNESIUM: 2.2 mg/dL (ref 1.5–2.5)

## 2018-02-21 LAB — IRON, TOTAL/TOTAL IRON BINDING CAP
%SAT: 24 % (ref 20–48)
IRON: 96 ug/dL (ref 50–180)
TIBC: 406 mcg/dL (calc) (ref 250–425)

## 2018-02-21 LAB — TSH: TSH: 1.01 m[IU]/L (ref 0.40–4.50)

## 2018-03-07 ENCOUNTER — Other Ambulatory Visit: Payer: Self-pay | Admitting: Internal Medicine

## 2018-03-07 DIAGNOSIS — K219 Gastro-esophageal reflux disease without esophagitis: Secondary | ICD-10-CM

## 2018-03-21 ENCOUNTER — Ambulatory Visit (INDEPENDENT_AMBULATORY_CARE_PROVIDER_SITE_OTHER): Payer: 59 | Admitting: Internal Medicine

## 2018-03-21 ENCOUNTER — Other Ambulatory Visit: Payer: Self-pay

## 2018-03-21 VITALS — BP 128/92 | HR 95 | Temp 97.2°F | Resp 16 | Ht 73.0 in | Wt 234.0 lb

## 2018-03-21 DIAGNOSIS — Z1211 Encounter for screening for malignant neoplasm of colon: Secondary | ICD-10-CM

## 2018-03-21 DIAGNOSIS — Z136 Encounter for screening for cardiovascular disorders: Secondary | ICD-10-CM | POA: Diagnosis not present

## 2018-03-21 DIAGNOSIS — Z131 Encounter for screening for diabetes mellitus: Secondary | ICD-10-CM

## 2018-03-21 DIAGNOSIS — E782 Mixed hyperlipidemia: Secondary | ICD-10-CM

## 2018-03-21 DIAGNOSIS — R0989 Other specified symptoms and signs involving the circulatory and respiratory systems: Secondary | ICD-10-CM

## 2018-03-21 DIAGNOSIS — K219 Gastro-esophageal reflux disease without esophagitis: Secondary | ICD-10-CM

## 2018-03-21 DIAGNOSIS — I10 Essential (primary) hypertension: Secondary | ICD-10-CM

## 2018-03-21 DIAGNOSIS — Z87891 Personal history of nicotine dependence: Secondary | ICD-10-CM

## 2018-03-21 DIAGNOSIS — Z8249 Family history of ischemic heart disease and other diseases of the circulatory system: Secondary | ICD-10-CM

## 2018-03-21 DIAGNOSIS — Z0001 Encounter for general adult medical examination with abnormal findings: Secondary | ICD-10-CM

## 2018-03-21 DIAGNOSIS — Z0000 Encounter for general adult medical examination without abnormal findings: Secondary | ICD-10-CM | POA: Diagnosis not present

## 2018-03-21 DIAGNOSIS — R5383 Other fatigue: Secondary | ICD-10-CM

## 2018-03-21 DIAGNOSIS — Z79899 Other long term (current) drug therapy: Secondary | ICD-10-CM

## 2018-03-21 DIAGNOSIS — Z13220 Encounter for screening for lipoid disorders: Secondary | ICD-10-CM

## 2018-03-21 DIAGNOSIS — E559 Vitamin D deficiency, unspecified: Secondary | ICD-10-CM

## 2018-03-21 DIAGNOSIS — Z1212 Encounter for screening for malignant neoplasm of rectum: Secondary | ICD-10-CM

## 2018-03-21 DIAGNOSIS — Z111 Encounter for screening for respiratory tuberculosis: Secondary | ICD-10-CM | POA: Diagnosis not present

## 2018-03-21 MED ORDER — FAMOTIDINE 40 MG PO TABS: ORAL_TABLET | ORAL | 3 refills | Status: DC

## 2018-03-21 NOTE — Patient Instructions (Signed)

## 2018-03-21 NOTE — Progress Notes (Signed)
[facility]   Lucky Cowboy, M.D.     Dyanne Carrel. Steffanie Dunn, P.A.-C Judd Gaudier, DNP [facility]                [address]                [place], [place]. [address] Telephone [phone] Telefax [phone] Annual  Screening/Preventative Visit  & Comprehensive Evaluation & Examination     This very nice 33 y.o. MWM presents for a Screening /Preventative Visit & comprehensive evaluation and management of multiple medical co-morbidities.  Patient is expectantly screened for HTN, HLD, Prediabetes and Vitamin D Deficiency. Patient has GERD controlled on his current meds.      Patient is monitored proactively for labile HTN predates since  2011 with BP 134/100 in Sept 2019.  He had tapered off of his Ziac 3-4 years ago. Today's BP is slightly elevated at 128/92. Patient denies any cardiac symptoms as chest pain, palpitations, shortness of breath, dizziness or ankle swelling.     Patient's is proactively screened for abnormal lipids.      Patient has Obesity (BMI 31 ) and is expectantly screened for glucose intolerance.   Patient denies reactive hypoglycemic symptoms, visual blurring, diabetic polys or paresthesias.      Finally, patient is not on Vit D supplementation & is anticipated to have Vitamin D Deficiency.  Current Outpatient Medications on File Prior to Visit  Medication Sig  . omeprazole (PRILOSEC) 40 MG capsule TAKE 1 CAPSULE EVERY NIGHT FOR ACID REFLUX   No current facility-administered medications on file prior to visit.    No Known Allergies   Past Medical History:  Diagnosis Date  . Anxiety   . GERD (gastroesophageal reflux disease)   . HTN (hypertension)   . Mixed hyperlipidemia   . Unspecified vitamin D deficiency    Health Maintenance  Topic Date Due  . HIV Screening  02/05/1998  . TETANUS/TDAP  06/22/2020  . INFLUENZA VACCINE  Completed   Immunization History  Administered Date(s)  Administered  . Influenza Inj Mdck Quad Pf 11/15/2016  . Influenza-Unspecified 10/02/2017  . PPD Test 03/21/2018  . Pneumococcal Polysaccharide-23 06/23/2010  . Tdap 06/23/2010   Past Surgical History:  Procedure Laterality Date  . CHOLECYSTECTOMY  2008   gallstones  . HERNIA REPAIR  age 12   Family History  Problem Relation Age of Onset  . Hypertension Father   . Colon cancer Neg Hx    Social History   Socioeconomic History  . Marital status: Married    Spouse name: Kennith Center  . Number of children: 2  Occupational History  . Occupation: [employer]   Tobacco Use  . Smoking status: Former Smoker    Last attempt to quit: 01/03/2003    Years since quitting: 15.2  . Smokeless tobacco: Never Used  Substance and Sexual Activity  . Alcohol use: Yes    Comment: Rarely  . Drug use: No  . Sexual activity: Not on file    ROS Constitutional: Denies fever, chills, weight loss/gain, headaches, insomnia,  night sweats or change in appetite. Does c/o fatigue. Eyes: Denies redness, blurred vision, diplopia, discharge, itchy or watery eyes.  ENT: Denies discharge, congestion, post nasal drip, epistaxis, sore throat, earache, hearing loss, dental pain, Tinnitus, Vertigo, Sinus pain or snoring.  Cardio: Denies chest pain, palpitations, irregular heartbeat, syncope, dyspnea, diaphoresis, orthopnea, PND, claudication or edema Respiratory: denies cough, dyspnea, DOE, pleurisy, hoarseness,  laryngitis or wheezing.  Gastrointestinal: Denies dysphagia, heartburn, reflux, water brash, pain, cramps, nausea, vomiting, bloating, diarrhea, constipation, hematemesis, melena, hematochezia, jaundice or hemorrhoids Genitourinary: Denies dysuria, frequency, urgency, nocturia, hesitancy, discharge, hematuria or flank pain Musculoskeletal: Denies arthralgia, myalgia, stiffness, Jt. Swelling, pain, limp or strain/sprain. Denies Falls. Skin: Denies puritis, rash, hives, warts, acne, eczema or  change in skin lesion Neuro: No weakness, tremor, incoordination, spasms, paresthesia or pain Psychiatric: Denies confusion, memory loss or sensory loss. Denies Depression. Endocrine: Denies change in weight, skin, hair change, nocturia, and paresthesia, diabetic polys, visual blurring or hyper / hypo glycemic episodes.  Heme/Lymph: No excessive bleeding, bruising or enlarged lymph nodes.  Physical Exam  BP (!) 128/92   Pulse 95   Temp (!) 97.2 F (36.2 C)   Resp 16   Ht 6\' 1"  (1.854 m)   Wt 234 lb (106.1 kg)   BMI 30.87 kg/m   General Appearance: Well nourished and well groomed and in no apparent distress.  Eyes: PERRLA, EOMs, conjunctiva no swelling or erythema, normal fundi and vessels. Sinuses: No frontal/maxillary tenderness ENT/Mouth: EACs patent / TMs  nl. Nares clear without erythema, swelling, mucoid exudates. Oral hygiene is good. No erythema, swelling, or exudate. Tongue normal, non-obstructing. Tonsils not swollen or erythematous. Hearing normal.  Neck: Supple, thyroid not palpable. No bruits, nodes or JVD. Respiratory: Respiratory effort normal.  BS equal and clear bilateral without rales, rhonci, wheezing or stridor. Cardio: Heart sounds are normal with regular rate and rhythm and no murmurs, rubs or gallops. Peripheral pulses are normal and equal bilaterally without edema. No aortic or femoral bruits. Chest: symmetric with normal excursions and percussion.  Abdomen: Soft, with Nl bowel sounds. Nontender, no guarding, rebound, hernias, masses, or organomegaly.  Lymphatics: Non tender without lymphadenopathy.  Musculoskeletal: Full ROM all peripheral extremities, joint stability, 5/5 strength, and normal gait. Skin: Warm and dry without rashes, lesions, cyanosis, clubbing or  ecchymosis.  Neuro: Cranial nerves intact, reflexes equal bilaterally. Normal muscle tone, no cerebellar symptoms. Sensation intact.  Pysch: Alert and oriented X 3 with normal affect, insight and  judgment appropriate.   Assessment and Plan  1. Annual Preventative/Screening Exam   2. Labile hypertension  - EKG 12-Lead - Urinalysis, Routine w reflex microscopic - Microalbumin / creatinine urine ratio - COMPLETE METABOLIC PANEL WITH GFR  3. Screening cholesterol level  - EKG 12-Lead - Lipid panel  4. Screening for diabetes mellitus  - Hemoglobin A1c - Insulin, random  5. Vitamin D deficiency  - VITAMIN D 25 Hydroxyl  6. Gastroesophageal reflux disease   7. Screening for colorectal cancer  - POC Hemoccult Bld/Stl   8. Screening-pulmonary TB  - TB Skin Test  9. Screening for ischemic heart disease  - EKG 12-Lead  10. FH: hypertension  - EKG 12-Lead  11. Former smoker  - EKG 12-Lead  12. Fatigue  - Vitamin B12 - Testosterone  13. Medication management  - Urinalysis, Routine w reflex microscopic - Microalbumin / creatinine urine ratio - COMPLETE METABOLIC PANEL WITH GFR - Lipid panel - Hemoglobin A1c - Insulin, random - VITAMIN D 25 Hydroxy  14. Hyperlipidemia, mixed              Patient was counseled in prudent diet, weight control to achieve/maintain BMI less than 25, BP monitoring, regular exercise and medications as discussed.  Discussed med effects and SE's. Routine screening labs and tests as requested with regular follow-up as recommended. Over 40 minutes of exam, counseling, chart review and  high complex critical decision making was performed

## 2018-03-22 ENCOUNTER — Encounter: Payer: Self-pay | Admitting: Internal Medicine

## 2018-03-22 DIAGNOSIS — R0989 Other specified symptoms and signs involving the circulatory and respiratory systems: Secondary | ICD-10-CM | POA: Insufficient documentation

## 2018-03-22 DIAGNOSIS — E782 Mixed hyperlipidemia: Secondary | ICD-10-CM | POA: Insufficient documentation

## 2018-03-22 DIAGNOSIS — E349 Endocrine disorder, unspecified: Secondary | ICD-10-CM | POA: Insufficient documentation

## 2018-03-22 DIAGNOSIS — E538 Deficiency of other specified B group vitamins: Secondary | ICD-10-CM | POA: Insufficient documentation

## 2018-03-22 DIAGNOSIS — E559 Vitamin D deficiency, unspecified: Secondary | ICD-10-CM | POA: Insufficient documentation

## 2018-03-22 LAB — LIPID PANEL
Cholesterol: 195 mg/dL (ref <200)
HDL: 39 mg/dL — ABNORMAL LOW (ref >=40)
LDL Cholesterol (Calc): 120 mg/dL (calc) — ABNORMAL HIGH
Non-HDL Cholesterol (Calc): 156 mg/dL (calc) — ABNORMAL HIGH (ref <130)
Total CHOL/HDL Ratio: 5 (calc) — ABNORMAL HIGH (ref <5.0)
Triglycerides: 249 mg/dL — ABNORMAL HIGH (ref <150)

## 2018-03-22 LAB — COMPLETE METABOLIC PANEL WITH GFR
AG Ratio: 1.8 (calc) (ref 1.0–2.5)
ALBUMIN MSPROF: 4.6 g/dL (ref 3.6–5.1)
ALKALINE PHOSPHATASE (APISO): 85 U/L (ref 36–130)
ALT: 59 U/L — ABNORMAL HIGH (ref 9–46)
AST: 33 U/L (ref 10–40)
BUN: 11 mg/dL (ref 7–25)
CO2: 28 mmol/L (ref 20–32)
Calcium: 9.5 mg/dL (ref 8.6–10.3)
Chloride: 101 mmol/L (ref 98–110)
Creat: 1.07 mg/dL (ref 0.60–1.35)
GFR, EST NON AFRICAN AMERICAN: 89 mL/min/{1.73_m2} (ref >=60)
GFR, Est African American: 104 mL/min/{1.73_m2} (ref >=60)
GLOBULIN: 2.5 g/dL (ref 1.9–3.7)
Glucose, Bld: 81 mg/dL (ref 65–99)
Potassium: 4 mmol/L (ref 3.5–5.3)
SODIUM: 138 mmol/L (ref 135–146)
Total Bilirubin: 1 mg/dL (ref 0.2–1.2)
Total Protein: 7.1 g/dL (ref 6.1–8.1)

## 2018-03-22 LAB — VITAMIN D 25 HYDROXY (VIT D DEFICIENCY, FRACTURES): Vit D, 25-Hydroxy: 14 ng/mL — ABNORMAL LOW (ref 30–100)

## 2018-03-22 LAB — URINALYSIS, ROUTINE W REFLEX MICROSCOPIC
BILIRUBIN URINE: NEGATIVE
Glucose, UA: NEGATIVE
Hgb urine dipstick: NEGATIVE
KETONES UR: NEGATIVE
Leukocytes,Ua: NEGATIVE
Nitrite: NEGATIVE
Protein, ur: NEGATIVE
SPECIFIC GRAVITY, URINE: 1.013 (ref 1.001–1.03)
pH: 6.5 (ref 5.0–8.0)

## 2018-03-22 LAB — HEMOGLOBIN A1C
Hgb A1c MFr Bld: 5.3 % of total Hgb (ref <5.7)
Mean Plasma Glucose: 105 (calc)
eAG (mmol/L): 5.8 (calc)

## 2018-03-22 LAB — MICROALBUMIN / CREATININE URINE RATIO
CREATININE, URINE: 108 mg/dL (ref 20–320)
MICROALB UR: 0.3 mg/dL
MICROALB/CREAT RATIO: 3 ug/mg{creat} (ref <30)

## 2018-03-22 LAB — TESTOSTERONE: Testosterone: 254 ng/dL (ref 250–827)

## 2018-03-22 LAB — VITAMIN B12: Vitamin B-12: 378 pg/mL (ref 200–1100)

## 2018-03-22 LAB — INSULIN, RANDOM: INSULIN: 13.8 u[IU]/mL

## 2018-03-23 MED ORDER — ESCITALOPRAM OXALATE 20 MG PO TABS: 20.0000 mg | ORAL_TABLET | Freq: Every day | ORAL | 0 refills | Status: DC

## 2018-03-24 ENCOUNTER — Encounter: Payer: Self-pay | Admitting: Internal Medicine

## 2018-03-25 LAB — TB SKIN TEST
Induration: 0 mm
TB SKIN TEST: NEGATIVE

## 2018-04-22 ENCOUNTER — Other Ambulatory Visit: Payer: Self-pay | Admitting: Internal Medicine

## 2018-04-22 DIAGNOSIS — F32A Depression, unspecified: Secondary | ICD-10-CM

## 2018-04-22 DIAGNOSIS — F329 Major depressive disorder, single episode, unspecified: Secondary | ICD-10-CM

## 2018-04-22 MED ORDER — BUPROPION HCL ER (XL) 300 MG PO TB24: ORAL_TABLET | ORAL | 0 refills | Status: DC

## 2018-06-24 ENCOUNTER — Other Ambulatory Visit: Payer: Self-pay | Admitting: Physician Assistant

## 2018-07-26 ENCOUNTER — Other Ambulatory Visit: Payer: Self-pay | Admitting: Internal Medicine

## 2018-07-26 DIAGNOSIS — F329 Major depressive disorder, single episode, unspecified: Secondary | ICD-10-CM

## 2018-07-26 DIAGNOSIS — F32A Depression, unspecified: Secondary | ICD-10-CM

## 2018-08-25 ENCOUNTER — Other Ambulatory Visit: Payer: Self-pay | Admitting: Internal Medicine

## 2018-08-25 DIAGNOSIS — K219 Gastro-esophageal reflux disease without esophagitis: Secondary | ICD-10-CM

## 2018-10-29 ENCOUNTER — Other Ambulatory Visit: Payer: Self-pay | Admitting: Adult Health

## 2018-10-29 DIAGNOSIS — F329 Major depressive disorder, single episode, unspecified: Secondary | ICD-10-CM

## 2018-10-29 DIAGNOSIS — F32A Depression, unspecified: Secondary | ICD-10-CM

## 2019-01-06 ENCOUNTER — Other Ambulatory Visit: Payer: Self-pay | Admitting: Internal Medicine

## 2019-01-08 ENCOUNTER — Other Ambulatory Visit: Payer: Self-pay | Admitting: Internal Medicine

## 2019-01-08 DIAGNOSIS — F329 Major depressive disorder, single episode, unspecified: Secondary | ICD-10-CM

## 2019-01-08 DIAGNOSIS — F32A Depression, unspecified: Secondary | ICD-10-CM

## 2019-03-12 ENCOUNTER — Other Ambulatory Visit: Payer: Self-pay | Admitting: Internal Medicine

## 2019-03-12 DIAGNOSIS — K219 Gastro-esophageal reflux disease without esophagitis: Secondary | ICD-10-CM

## 2019-05-08 ENCOUNTER — Encounter: Payer: 59 | Admitting: Internal Medicine

## 2019-05-30 ENCOUNTER — Other Ambulatory Visit: Payer: Self-pay | Admitting: Internal Medicine

## 2019-05-30 MED ORDER — BISOPROLOL-HYDROCHLOROTHIAZIDE 5-6.25 MG PO TABS: ORAL_TABLET | ORAL | 0 refills | Status: DC

## 2019-07-06 ENCOUNTER — Other Ambulatory Visit: Payer: Self-pay | Admitting: Internal Medicine

## 2019-07-06 DIAGNOSIS — F32A Depression, unspecified: Secondary | ICD-10-CM

## 2019-07-06 MED ORDER — BUPROPION HCL ER (XL) 300 MG PO TB24: ORAL_TABLET | ORAL | 0 refills | Status: DC

## 2019-07-06 MED ORDER — ESCITALOPRAM OXALATE 20 MG PO TABS: ORAL_TABLET | ORAL | 0 refills | Status: DC

## 2019-07-22 ENCOUNTER — Encounter: Payer: Self-pay | Admitting: Internal Medicine

## 2019-07-22 NOTE — Progress Notes (Signed)
Annual  Screening/Preventative Visit  & Comprehensive Evaluation & Examination     This very nice 34 y.o.  MWM presents for a Screening /Preventative Visit & comprehensive evaluation and management of multiple medical co-morbidities.  Patient has been followed for HTN, HLD, T2_NIDDM  Prediabetes and Vitamin D Deficiency. Vitamin B12 level was low at 378 in Mar 2020 & patient was advised to take supplemental vitamin B12.      HTN predates since 2011 and patient had tapered off meds about 2016 and then restarted meds in May with BP's consistently elevated.  Patient's BP has been controlled at home.  Today's BP is at goal - 120/84. Patient denies any cardiac symptoms as chest pain, palpitations, shortness of breath, dizziness or ankle swelling.     Patient's hyperlipidemia is controlled with diet and medications. Patient denies myalgias or other medication SE's. Last lipids were not at goal:  Lab Results  Component Value Date   CHOL 195 03/21/2018   HDL 39 (L) 03/21/2018   LDLCALC 120 (H) 03/21/2018   TRIG 249 (H) 03/21/2018   CHOLHDL 5.0 (H) 03/21/2018       Patient  is overweight (BMI 31) and is screened expectantly screened for glucose intolerance.  Patient denies reactive hypoglycemic symptoms, visual blurring, diabetic polys or paresthesias. Last A1c was Normal & at goal:  Lab Results  Component Value Date   HGBA1C 5.3 03/21/2018        Patient has hx/o low Testosterone  254 in Mar 2020 and was advised to take Zinc 50 mg daily.        Finally, patient has history of Vitamin D Deficiency and last vitamin D was very Low:  Lab Results  Component Value Date   VD25OH 14 (L) 03/21/2018    Current Outpatient Medications on File Prior to Visit  Medication Sig  . bisoprolol-hydrochlorothiazide (ZIAC) 5-6.25 MG tablet Take 1 tablet daily for BP  . buPROPion (WELLBUTRIN XL) 300 MG 24 hr tablet Take 1 tablet every Morning for Mood, Focus & Concentration  . escitalopram (LEXAPRO) 20  MG tablet Take 1 tablet Daily for Mood  . famotidine (PEPCID) 40 MG tablet Take 1 tablet at bedtime for Acid Reflux  . omeprazole (PRILOSEC) 40 MG capsule Take 1 capsule Daily for Indigestion & Heartburn   No current facility-administered medications on file prior to visit.   No Known Allergies   Past Medical History:  Diagnosis Date  . Anxiety   . GERD (gastroesophageal reflux disease)   . HTN (hypertension)   . Mixed hyperlipidemia   . Unspecified vitamin D deficiency    Health Maintenance  Topic Date Due  . Hepatitis C Screening  Never done  . COVID-19 Vaccine (1) Never done  . HIV Screening  Never done  . INFLUENZA VACCINE  08/03/2019  . TETANUS/TDAP  06/22/2020   Immunization History  Administered Date(s) Administered  . Influenza Inj Mdck Quad Pf 11/15/2016  . Influenza-Unspecified 10/02/2017  . PPD Test 03/21/2018, 07/23/2019  . Pneumococcal Polysaccharide-23 06/23/2010  . Tdap 06/23/2010    Past Surgical History:  Procedure Laterality Date  . CHOLECYSTECTOMY  2008   gallstones  . HERNIA REPAIR  age 1   Family History  Problem Relation Age of Onset  . Hypertension Father   . Colon cancer Neg Hx    Social History   Socioeconomic History  . Marital status: Married    Spouse name: Not on file  . Number of children: 2  Occupational History  .  Occupation: [employer]  . Smoking status: Former Smoker    Quit date: 01/03/2003    Years since quitting: 16.5  . Smokeless tobacco: Never Used  Substance and Sexual Activity  . Alcohol use: Yes    Comment: Rarely  . Drug use: No  . Sexual activity: Not on file    ROS Constitutional: Denies fever, chills, weight loss/gain, headaches, insomnia,  night sweats or change in appetite. Does c/o fatigue. Eyes: Denies redness, blurred vision, diplopia, discharge, itchy or watery eyes.  ENT: Denies discharge, congestion, post nasal drip, epistaxis, sore throat, earache, hearing loss, dental pain,  Tinnitus, Vertigo, Sinus pain or snoring.  Cardio: Denies chest pain, palpitations, irregular heartbeat, syncope, dyspnea, diaphoresis, orthopnea, PND, claudication or edema Respiratory: denies cough, dyspnea, DOE, pleurisy, hoarseness, laryngitis or wheezing.  Gastrointestinal: Denies dysphagia, heartburn, reflux, water brash, pain, cramps, nausea, vomiting, bloating, diarrhea, constipation, hematemesis, melena, hematochezia, jaundice or hemorrhoids Genitourinary: Denies dysuria, frequency, urgency, nocturia, hesitancy, discharge, hematuria or flank pain Musculoskeletal: Denies arthralgia, myalgia, stiffness, Jt. Swelling, pain, limp or strain/sprain. Denies Falls. Skin: Denies puritis, rash, hives, warts, acne, eczema or change in skin lesion Neuro: No weakness, tremor, incoordination, spasms, paresthesia or pain Psychiatric: Denies confusion, memory loss or sensory loss. Denies Depression. Endocrine: Denies change in weight, skin, hair change, nocturia, and paresthesia, diabetic polys, visual blurring or hyper / hypo glycemic episodes.  Heme/Lymph: No excessive bleeding, bruising or enlarged lymph nodes.  Physical Exam  BP 120/84   Pulse 72   Temp 97.9 F (36.6 C)   Resp 16   Ht 6\' 1"  (1.854 m)   Wt 240 lb 6.4 oz (109 kg)   BMI 31.72 kg/m   General Appearance: Well nourished and well groomed and in no apparent distress.  Eyes: PERRLA, EOMs, conjunctiva no swelling or erythema, normal fundi and vessels. Sinuses: No frontal/maxillary tenderness ENT/Mouth: EACs patent / TMs  nl. Nares clear without erythema, swelling, mucoid exudates. Oral hygiene is good. No erythema, swelling, or exudate. Tongue normal, non-obstructing. Tonsils not swollen or erythematous. Hearing normal.  Neck: Supple, thyroid not palpable. No bruits, nodes or JVD. Respiratory: Respiratory effort normal.  BS equal and clear bilateral without rales, rhonci, wheezing or stridor. Cardio: Heart sounds are normal with  regular rate and rhythm and no murmurs, rubs or gallops. Peripheral pulses are normal and equal bilaterally without edema. No aortic or femoral bruits. Chest: symmetric with normal excursions and percussion.  Abdomen: Soft, with Nl bowel sounds. Nontender, no guarding, rebound, hernias, masses, or organomegaly.  Lymphatics: Non tender without lymphadenopathy.  Musculoskeletal: Full ROM all peripheral extremities, joint stability, 5/5 strength, and normal gait. Skin: Warm and dry without rashes, lesions, cyanosis, clubbing or  ecchymosis.  Neuro: Cranial nerves intact, reflexes equal bilaterally. Normal muscle tone, no cerebellar symptoms. Sensation intact.  Pysch: Alert and oriented X 3 with normal affect, insight and judgment appropriate.   Assessment and Plan  1. Annual Preventative/Screening Exam    2. Essential hypertension  - EKG 12-Lead - Urinalysis, Routine w reflex microscopic - Microalbumin / creatinine urine ratio - CBC with Differential/Platelet - COMPLETE METABOLIC PANEL WITH GFR - Magnesium - TSH  3. Hyperlipidemia, mixed  - Lipid panel - TSH  4. Abnormal glucose  - Hemoglobin A1c - Insulin, random  5. Vitamin D deficiency  - VITAMIN D 25 Hydroxy  6. Testosterone deficiency  - Testosterone  7. Vitamin B12 deficiency  - Vitamin B12  8. Screening for colorectal cancer  - POC  Hemoccult Bld/Stl   9. Screening-pulmonary TB  - TB Skin Test  10. Screening for ischemic heart disease  - EKG 12-Lead  11. FH: hypertension  - EKG 12-Lead  12. Former smoker  - EKG 12-Lead  13. Fatigue  - Iron,Total/Total Iron Binding Cap - Vitamin B12 - Testosterone - CBC with Differential/Platelet - TSH  14. Medication management  - Urinalysis, Routine w reflex microscopic - Microalbumin / creatinine urine ratio - CBC with Differential/Platelet - COMPLETE METABOLIC PANEL WITH GFR - Magnesium - Lipid panel - TSH - HemoglobinA1c          Patient  was counseled in prudent diet, weight control to achieve/maintain BMI less than 25, BP monitoring, regular exercise and medications as discussed.  Discussed med effects and SE's. Routine screening labs and tests as requested with regular follow-up as recommended. Over 40 minutes of exam, counseling, chart review and high complex critical decision making was performed   Marinus Maw, MD

## 2019-07-22 NOTE — Patient Instructions (Signed)
Due to recent changes in healthcare laws, you may see the results of your imaging and laboratory studies on MyChart before your provider has had a chance to review them.  We understand that in some cases there may be results that are confusing or concerning to you. Not all laboratory results come back in the same time frame and the provider may be waiting for multiple results in order to interpret others.  Please give [place] 48 hours in order for your provider to thoroughly review all the results before contacting the office for clarification of your results.   +++++++++++++++++++++++++++++++++  Vit D  & Vit C 1,000 mg   are recommended to help protect  against the Covid-19 and other Corona viruses.    Also it's recommended  to take  Zinc 50 mg  to help  protect against the Covid-19   and best place to get  is also on [email or web address]  and don't pay more than 6-8 cents /pill !  ================================= Coronavirus (COVID-19) Are you at risk?  Are you at risk for the Coronavirus (COVID-19)?  To be considered HIGH RISK for Coronavirus (COVID-19), you have to meet the following criteria:  . Traveled to [place], [place], [place], [place] or [place]; or in the [place] to [place], [place], [place]  . or [place]; and have fever, cough, and shortness of breath within the last 2 weeks of travel OR . Been in close contact with a person diagnosed with COVID-19 within the last 2 weeks and have  . fever, cough,and shortness of breath .  . IF YOU DO NOT MEET THESE CRITERIA, YOU ARE CONSIDERED LOW RISK FOR COVID-19.  What to do if you are HIGH RISK for COVID-19?  Marland Kitchen If you are having a medical emergency, call 911. . Seek medical care right away. Before you go to a doctor's office, urgent care or emergency department, .  call ahead and tell them about your recent travel, contact with someone diagnosed with COVID-19  .  and your symptoms.  . You should receive instructions from your  physician's office regarding next steps of care.  . When you arrive at healthcare provider, tell the healthcare staff immediately you have returned from  . visiting [place], [place], [place], [place] or [place]; or traveled in the [place] to [place], [place],  . [place] or [place] in the last two weeks or you have been in close contact with a person diagnosed with  . COVID-19 in the last 2 weeks.   . Tell the health care staff about your symptoms: fever, cough and shortness of breath. . After you have been seen by a medical provider, you will be either: o Tested for (COVID-19) and discharged home on quarantine except to seek medical care if  o symptoms worsen, and asked to  - Stay home and avoid contact with others until you get your results (4-5 days)  - Avoid travel on public transportation if possible (such as bus, train, or airplane) or o Sent to the Emergency Department by EMS for evaluation, COVID-19 testing  and  o possible admission depending on your condition and test results.  What to do if you are LOW RISK for COVID-19?  Reduce your risk of any infection by using the same precautions used for avoiding the common cold or flu:  Marland Kitchen Wash your hands often with soap and warm water for at least 20 seconds.  If soap and water are not readily  available,  . use an alcohol-based hand sanitizer with at least 60% alcohol.  . If coughing or sneezing, cover your mouth and nose by coughing or sneezing into the elbow areas of your shirt or coat, .  into a tissue or into your sleeve (not your hands). . Avoid shaking hands with others and consider head nods or verbal greetings only. . Avoid touching your eyes, nose, or mouth with unwashed hands.  . Avoid close contact with people who are sick. . Avoid places or events with large numbers of people in one location, like concerts or sporting events. . Carefully consider travel plans you have or are making. . If you are planning any travel  outside or inside the [place], visit the CDC's Travelers' Health webpage for the latest health notices. . If you have some symptoms but not all symptoms, continue to monitor at home and seek medical attention  . if your symptoms worsen. . If you are having a medical emergency, call 911. >>>>>>>>>>>>>>>>>>>>>>>> Preventive Care for Adults  A healthy lifestyle and preventive care can promote health and wellness. Preventive health guidelines for men include the following key practices:  A routine yearly physical is a good way to check with your health care provider about your health and preventative screening. It is a chance to share any concerns and updates on your health and to receive a thorough exam.  Visit your dentist for a routine exam and preventative care every 6 months. Brush your teeth twice a day and floss once a day. Good oral hygiene prevents tooth decay and gum disease.  The frequency of eye exams is based on your age, health, family medical history, use of contact lenses, and other factors. Follow your health care provider's recommendations for frequency of eye exams.  Eat a healthy diet. Foods such as vegetables, fruits, whole grains, low-fat dairy products, and lean protein foods contain the nutrients you need without too many calories. Decrease your intake of foods high in solid fats, added sugars, and salt. Eat the right amount of calories for you. Get information about a proper diet from your health care provider, if necessary.  Regular physical exercise is one of the most important things you can do for your health. Most adults should get at least 150 minutes of moderate-intensity exercise (any activity that increases your heart rate and causes you to sweat) each week. In addition, most adults need muscle-strengthening exercises on 2 or more days a week.  Maintain a healthy weight. The body mass index (BMI) is a screening tool to identify possible weight problems. It provides an  estimate of body fat based on height and weight. Your health care provider can find your BMI and can help you achieve or maintain a healthy weight. For adults 20 years and older:  A BMI below 18.5 is considered underweight.  A BMI of 18.5 to 24.9 is normal.  A BMI of 25 to 29.9 is considered overweight.  A BMI of 30 and above is considered obese.  Maintain normal blood lipids and cholesterol levels by exercising and minimizing your intake of saturated fat. Eat a balanced diet with plenty of fruit and vegetables. Blood tests for lipids and cholesterol should begin at age 45 and be repeated every 5 years. If your lipid or cholesterol levels are high, you are over 50, or you are at high risk for heart disease, you may need your cholesterol levels checked more frequently. Ongoing high lipid and cholesterol levels should be treated  with medicines if diet and exercise are not working.  If you smoke, find out from your health care provider how to quit. If you do not use tobacco, do not start.  Lung cancer screening is recommended for adults aged 2-80 years who are at high risk for developing lung cancer because of a history of smoking. A yearly low-dose CT scan of the lungs is recommended for people who have at least a 30-pack-year history of smoking and are a current smoker or have quit within the past 15 years. A pack year of smoking is smoking an average of 1 pack of cigarettes a day for 1 year (for example: 1 pack a day for 30 years or 2 packs a day for 15 years). Yearly screening should continue until the smoker has stopped smoking for at least 15 years. Yearly screening should be stopped for people who develop a health problem that would prevent them from having lung cancer treatment.  If you choose to drink alcohol, do not have more than 2 drinks per day. One drink is considered to be 12 ounces (355 mL) of beer, 5 ounces (148 mL) of wine, or 1.5 ounces (44 mL) of liquor.  High blood pressure  causes heart disease and increases the risk of stroke. Your blood pressure should be checked. Ongoing high blood pressure should be treated with medicines, if weight loss and exercise are not effective.  If you are 35-59 years old, ask your health care provider if you should take aspirin to prevent heart disease.  Diabetes screening involves taking a blood sample to check your fasting blood sugar level. Testing should be considered at a younger age or be carried out more frequently if you are overweight and have at least 1 risk factor for diabetes.  Colorectal cancer can be detected and often prevented. Most routine colorectal cancer screening begins at the age of 50 and continues through age 67. However, your health care provider may recommend screening at an earlier age if you have risk factors for colon cancer. On a yearly basis, your health care provider may provide home test kits to check for hidden blood in the stool. Use of a small camera at the end of a tube to directly examine the colon (sigmoidoscopy or colonoscopy) can detect the earliest forms of colorectal cancer. Talk to your health care provider about this at age 75, when routine screening begins. Direct exam of the colon should be repeated every 5-10 years through age 50, unless early forms of precancerous polyps or small growths are found.  Screening for abdominal aortic aneurysm (AAA)  are recommended for persons over age 31 who have history of hypertensionor who are current or former smokers.  Talk with your health care provider about prostate cancer screening.  Testicular cancer screening is recommended for adult males. Screening includes self-exam, a health care provider exam, and other screening tests. Consult with your health care provider about any symptoms you have or any concerns you have about testicular cancer.  Use sunscreen. Apply sunscreen liberally and repeatedly throughout the day. You should seek shade when your shadow  is shorter than you. Protect yourself by wearing long sleeves, pants, a wide-brimmed hat, and sunglasses year round, whenever you are outdoors.  Once a month, do a whole-body skin exam, using a mirror to look at the skin on your back. Tell your health care provider about new moles, moles that have irregular borders, moles that are larger than a pencil eraser, or moles that  have changed in shape or color.  Stay current with required vaccines (immunizations).  Influenza vaccine. All adults should be immunized every year.  Tetanus, diphtheria, and acellular pertussis (Td, Tdap) vaccine. An adult who has not previously received Tdap or who does not know his vaccine status should receive 1 dose of Tdap. This initial dose should be followed by tetanus and diphtheria toxoids (Td) booster doses every 10 years. Adults with an unknown or incomplete history of completing a 3-dose immunization series with Td-containing vaccines should begin or complete a primary immunization series including a Tdap dose. Adults should receive a Td booster every 10 years.  Zoster vaccine. One dose is recommended for adults aged 60 years or older unless certain conditions are present.    Pneumococcal 13-valent conjugate (PCV13) vaccine. When indicated, a person who is uncertain of his immunization history and has no record of immunization should receive the PCV13 vaccine. An adult aged 19 years or older who has certain medical conditions and has not been previously immunized should receive 1 dose of PCV13 vaccine. This PCV13 should be followed with a dose of pneumococcal polysaccharide (PPSV23) vaccine. The PPSV23 vaccine dose should be obtained at least 8 weeks after the dose of PCV13 vaccine. An adult aged 19 years or older who has certain medical conditions and previously received 1 or more doses of PPSV23 vaccine should receive 1 dose of PCV13. The PCV13 vaccine dose should be obtained 1 or more years after the last PPSV23  vaccine dose.    Pneumococcal polysaccharide (PPSV23) vaccine. When PCV13 is also indicated, PCV13 should be obtained first. All adults aged 65 years and older should be immunized. An adult younger than age 65 years who has certain medical conditions should be immunized. Any person who resides in a nursing home or long-term care facility should be immunized. An adult smoker should be immunized. People with an immunocompromised condition and certain other conditions should receive both PCV13 and PPSV23 vaccines. People with human immunodeficiency virus (HIV) infection should be immunized as soon as possible after diagnosis. Immunization during chemotherapy or radiation therapy should be avoided. Routine use of PPSV23 vaccine is not recommended for American Indians, [place] Natives, or people younger than 65 years unless there are medical conditions that require PPSV23 vaccine. When indicated, people who have unknown immunization and have no record of immunization should receive PPSV23 vaccine. One-time revaccination 5 years after the first dose of PPSV23 is recommended for people aged 19-64 years who have chronic kidney failure, nephrotic syndrome, asplenia, or immunocompromised conditions. People who received 1-2 doses of PPSV23 before age 65 years should receive another dose of PPSV23 vaccine at age 65 years or later if at least 5 years have passed since the previous dose. Doses of PPSV23 are not needed for people immunized with PPSV23 at or after age 65 years.  Hepatitis A vaccine. Adults who wish to be protected from this disease, have certain high-risk conditions, work with hepatitis A-infected animals, work in hepatitis A research labs, or travel to or work in countries with a high rate of hepatitis A should be immunized. Adults who were previously unvaccinated and who anticipate close contact with an international adoptee during the first 60 days after arrival in the [place] from a country with a  high rate of hepatitis A should be immunized.  Hepatitis B vaccine. Adults should be immunized if they wish to be protected from this disease, have certain high-risk conditions, may be exposed to blood or other infectious   body fluids, are household contacts or sex partners of hepatitis B positive people, are clients or workers in certain care facilities, or travel to or work in countries with a high rate of hepatitis B.  Preventive Service / Frequency  Ages 19 to 28  Blood pressure check.  Lipid and cholesterol check.  Hepatitis C blood test.** / For any individual with known risks for hepatitis C.  Skin self-exam. / Monthly.  Influenza vaccine. / Every year.  Tetanus, diphtheria, and acellular pertussis (Tdap, Td) vaccine.** / Consult your health care provider. 1 dose of Td every 10 years.  HPV vaccine. / 3 doses over 6 months, if 49 or younger.  Measles, mumps, rubella (MMR) vaccine.** / You need at least 1 dose of MMR if you were born in 1957 or later. You may also need a second dose.  Pneumococcal 13-valent conjugate (PCV13) vaccine.** / Consult your health care provider.  Pneumococcal polysaccharide (PPSV23) vaccine.** / 1 to 2 doses if you smoke cigarettes or if you have certain conditions.  Meningococcal vaccine.** / 1 dose if you are age 33 to 88 years and a Market researcher living in a residence hall, or have one of several medical conditions. You may also need additional booster doses.  Hepatitis A vaccine.** / Consult your health care provider.  Hepatitis B vaccine.** / Consult your health care provider. +++++++++ Recommend Adult Low Dose Aspirin or  coated  Aspirin 81 mg daily  To reduce risk of Colon Cancer 40 %,  Skin Cancer 26 % ,  Melanoma 46%  and  Pancreatic cancer 60% ++++++++++++++++++ Vitamin D goal  is between 70-100.  Please make sure that you are taking your Vitamin D as directed.  It is very important as a natural anti-inflammatory   helping hair, skin, and nails, as well as reducing stroke and heart attack risk.  It helps your bones and helps with mood. It also decreases numerous cancer risks so please take it as directed.  Low Vit D is associated with a 200-300% higher risk for CANCER  and 200-300% higher risk for HEART   ATTACK  &  STROKE.   .....................................Marland Kitchen It is also associated with higher death rate at younger ages,  autoimmune diseases like Rheumatoid arthritis, Lupus, Multiple Sclerosis.    Also many other serious conditions, like depression, Alzheimer's Dementia, infertility, muscle aches, fatigue, fibromyalgia - just to name a few. +++++++++++++++++++++ Recommend the book "The END of DIETING" by Dr Excell Seltzer  & the book "The END of DIABETES " by Dr Excell Seltzer At [email or web address] - get book & Audio CD's    Being diabetic has a  300% increased risk for heart attack, stroke, cancer, and alzheimer- type vascular dementia. It is very important that you work harder with diet by avoiding all foods that are white. Avoid white rice (brown & wild rice is OK), white potatoes (sweetpotatoes in moderation is OK), White bread or wheat bread or anything made out of white flour like bagels, donuts, rolls, buns, biscuits, cakes, pastries, cookies, pizza crust, and pasta (made from white flour & egg whites) - vegetarian pasta or spinach or wheat pasta is OK. Multigrain breads like Arnold's or [employer], or multigrain sandwich thins or flatbreads.  Diet, exercise and weight loss can reverse and cure diabetes in the early stages.  Diet, exercise and weight loss is very important in the control and prevention of complications of diabetes which affects every system in your body, ie. Brain - dementia/stroke, eyes -  glaucoma/blindness, heart - heart attack/heart failure, kidneys - dialysis, stomach - gastric paralysis, intestines - malabsorption, nerves - severe painful neuritis, circulation - gangrene & loss of a  leg(s), and finally cancer and Alzheimers.    I recommend avoid fried & greasy foods,  sweets/candy, white rice (brown or wild rice or Quinoa is OK), white potatoes (sweet potatoes are OK) - anything made from white flour - bagels, doughnuts, rolls, buns, biscuits,white and wheat breads, pizza crust and traditional pasta made of white flour & egg white(vegetarian pasta or spinach or wheat pasta is OK).  Multi-grain bread is OK - like multi-grain flat bread or sandwich thins. Avoid alcohol in excess. Exercise is also important.    Eat all the vegetables you want - avoid meat, especially red meat and dairy - especially cheese.  Cheese is the most concentrated form of trans-fats which is the worst thing to clog up our arteries. Veggie cheese is OK which can be found in the fresh produce section at [employer] or [employer] or [employer]  +++++++++++++++++++ DASH Eating Plan  DASH stands for "Dietary Approaches to Stop Hypertension."   The DASH eating plan is a healthy eating plan that has been shown to reduce high blood pressure (hypertension). Additional health benefits may include reducing the risk of type 2 diabetes mellitus, heart disease, and stroke. The DASH eating plan may also help with weight loss. WHAT DO I NEED TO KNOW ABOUT THE DASH EATING PLAN? For the DASH eating plan, you will follow these general guidelines:  Choose foods with a percent daily value for sodium of less than 5% (as listed on the food label).  Use salt-free seasonings or herbs instead of table salt or sea salt.  Check with your health care provider or pharmacist before using salt substitutes.  Eat lower-sodium products, often labeled as "lower sodium" or "no salt added."  Eat fresh foods.  Eat more vegetables, fruits, and low-fat dairy products.  Choose whole grains. Look for the word "whole" as the first word in the ingredient list.  Choose fish   Limit sweets, desserts, sugars, and sugary drinks.  Choose  heart-healthy fats.  Eat veggie cheese   Eat more home-cooked food and less restaurant, buffet, and fast food.  Limit fried foods.  Cook foods using methods other than frying.  Limit canned vegetables. If you do use them, rinse them well to decrease the sodium.  When eating at a restaurant, ask that your food be prepared with less salt, or no salt if possible.                      WHAT FOODS CAN I EAT? Read Dr Fara Olden Fuhrman's books on The End of Dieting & The End of Diabetes  Grains Whole grain or whole wheat bread. Brown rice. Whole grain or whole wheat pasta. Quinoa, bulgur, and whole grain cereals. Low-sodium cereals. Corn or whole wheat flour tortillas. Whole grain cornbread. Whole grain crackers. Low-sodium crackers.  Vegetables Fresh or frozen vegetables (raw, steamed, roasted, or grilled). Low-sodium or reduced-sodium tomato and vegetable juices. Low-sodium or reduced-sodium tomato sauce and paste. Low-sodium or reduced-sodium canned vegetables.   Fruits All fresh, canned (in natural juice), or frozen fruits.  Protein Products  All fish and seafood.  Dried beans, peas, or lentils. Unsalted nuts and seeds. Unsalted canned beans.  Dairy Low-fat dairy products, such as skim or 1% milk, 2% or reduced-fat cheeses, low-fat ricotta or cottage cheese, or plain low-fat yogurt. Low-sodium  or reduced-sodium cheeses.  Fats and Oils Tub margarines without trans fats. Light or reduced-fat mayonnaise and salad dressings (reduced sodium). Avocado. Safflower, olive, or canola oils. Natural peanut or almond butter.  Other Unsalted popcorn and pretzels. The items listed above may not be a complete list of recommended foods or beverages. Contact your dietitian for more options.  +++++++++++++++++++  WHAT FOODS ARE NOT RECOMMENDED? Grains/ White flour or wheat flour White bread. White pasta. White rice. Refined cornbread. Bagels and croissants. Crackers that contain trans  fat.  Vegetables  Creamed or fried vegetables. Vegetables in a . Regular canned vegetables. Regular canned tomato sauce and paste. Regular tomato and vegetable juices.  Fruits Dried fruits. Canned fruit in light or heavy syrup. Fruit juice.  Meat and Other Protein Products Meat in general - RED meat & White meat.  Fatty cuts of meat. Ribs, chicken wings, all processed meats as bacon, sausage, bologna, salami, fatback, hot dogs, bratwurst and packaged luncheon meats.  Dairy Whole or 2% milk, cream, half-and-half, and cream cheese. Whole-fat or sweetened yogurt. Full-fat cheeses or blue cheese. Non-dairy creamers and whipped toppings. Processed cheese, cheese spreads, or cheese curds.  Condiments Onion and garlic salt, seasoned salt, table salt, and sea salt. Canned and packaged gravies. Worcestershire sauce. Tartar sauce. Barbecue sauce. Teriyaki sauce. Soy sauce, including reduced sodium. Steak sauce. Fish sauce. Oyster sauce. Cocktail sauce. Horseradish. Ketchup and mustard. Meat flavorings and tenderizers. Bouillon cubes. Hot sauce. Tabasco sauce. Marinades. Taco seasonings. Relishes.  Fats and Oils Butter, stick margarine, lard, shortening and bacon fat. Coconut, palm kernel, or palm oils. Regular salad dressings.  Pickles and olives. Salted popcorn and pretzels.  The items listed above may not be a complete list of foods and beverages to avoid.

## 2019-07-23 ENCOUNTER — Ambulatory Visit (INDEPENDENT_AMBULATORY_CARE_PROVIDER_SITE_OTHER): Payer: 59 | Admitting: Internal Medicine

## 2019-07-23 ENCOUNTER — Other Ambulatory Visit: Payer: Self-pay

## 2019-07-23 VITALS — BP 120/84 | HR 72 | Temp 97.9°F | Resp 16 | Ht 73.0 in | Wt 240.4 lb

## 2019-07-23 DIAGNOSIS — Z136 Encounter for screening for cardiovascular disorders: Secondary | ICD-10-CM | POA: Diagnosis not present

## 2019-07-23 DIAGNOSIS — Z1211 Encounter for screening for malignant neoplasm of colon: Secondary | ICD-10-CM

## 2019-07-23 DIAGNOSIS — Z8249 Family history of ischemic heart disease and other diseases of the circulatory system: Secondary | ICD-10-CM

## 2019-07-23 DIAGNOSIS — E538 Deficiency of other specified B group vitamins: Secondary | ICD-10-CM

## 2019-07-23 DIAGNOSIS — Z79899 Other long term (current) drug therapy: Secondary | ICD-10-CM

## 2019-07-23 DIAGNOSIS — R7309 Other abnormal glucose: Secondary | ICD-10-CM

## 2019-07-23 DIAGNOSIS — Z0001 Encounter for general adult medical examination with abnormal findings: Secondary | ICD-10-CM

## 2019-07-23 DIAGNOSIS — E782 Mixed hyperlipidemia: Secondary | ICD-10-CM

## 2019-07-23 DIAGNOSIS — I10 Essential (primary) hypertension: Secondary | ICD-10-CM | POA: Diagnosis not present

## 2019-07-23 DIAGNOSIS — Z111 Encounter for screening for respiratory tuberculosis: Secondary | ICD-10-CM | POA: Diagnosis not present

## 2019-07-23 DIAGNOSIS — Z87891 Personal history of nicotine dependence: Secondary | ICD-10-CM

## 2019-07-23 DIAGNOSIS — E559 Vitamin D deficiency, unspecified: Secondary | ICD-10-CM

## 2019-07-23 DIAGNOSIS — E349 Endocrine disorder, unspecified: Secondary | ICD-10-CM

## 2019-07-23 DIAGNOSIS — Z0000 Encounter for general adult medical examination without abnormal findings: Secondary | ICD-10-CM

## 2019-07-23 DIAGNOSIS — Z1212 Encounter for screening for malignant neoplasm of rectum: Secondary | ICD-10-CM

## 2019-07-23 DIAGNOSIS — R5383 Other fatigue: Secondary | ICD-10-CM

## 2019-07-24 LAB — URINALYSIS, ROUTINE W REFLEX MICROSCOPIC
Bilirubin Urine: NEGATIVE
Glucose, UA: NEGATIVE
Hgb urine dipstick: NEGATIVE
Ketones, ur: NEGATIVE
Leukocytes,Ua: NEGATIVE
Nitrite: NEGATIVE
Protein, ur: NEGATIVE
Specific Gravity, Urine: 1.006 (ref 1.001–1.03)
pH: 7 (ref 5.0–8.0)

## 2019-07-24 LAB — MAGNESIUM: Magnesium: 2.3 mg/dL (ref 1.5–2.5)

## 2019-07-24 LAB — CBC WITH DIFFERENTIAL/PLATELET
Absolute Monocytes: 622 cells/uL (ref 200–950)
Basophils Absolute: 30 cells/uL (ref 0–200)
Basophils Relative: 0.4 %
Eosinophils Absolute: 59 cells/uL (ref 15–500)
Eosinophils Relative: 0.8 %
HCT: 43.9 % (ref 38.5–50.0)
Hemoglobin: 14.9 g/dL (ref 13.2–17.1)
Lymphs Abs: 2287 cells/uL (ref 850–3900)
MCH: 31.3 pg (ref 27.0–33.0)
MCHC: 33.9 g/dL (ref 32.0–36.0)
MCV: 92.2 fL (ref 80.0–100.0)
MPV: 12.6 fL — ABNORMAL HIGH (ref 7.5–12.5)
Monocytes Relative: 8.4 %
Neutro Abs: 4403 cells/uL (ref 1500–7800)
Neutrophils Relative %: 59.5 %
Platelets: 258 10*3/uL (ref 140–400)
RBC: 4.76 10*6/uL (ref 4.20–5.80)
RDW: 12.9 % (ref 11.0–15.0)
Total Lymphocyte: 30.9 %
WBC: 7.4 10*3/uL (ref 3.8–10.8)

## 2019-07-24 LAB — LIPID PANEL
Cholesterol: 213 mg/dL — ABNORMAL HIGH (ref <200)
HDL: 34 mg/dL — ABNORMAL LOW (ref >=40)
Non-HDL Cholesterol (Calc): 179 mg/dL (calc) — ABNORMAL HIGH (ref <130)
Total CHOL/HDL Ratio: 6.3 (calc) — ABNORMAL HIGH (ref <5.0)
Triglycerides: 496 mg/dL — ABNORMAL HIGH (ref <150)

## 2019-07-24 LAB — COMPLETE METABOLIC PANEL WITH GFR
AG Ratio: 1.7 (calc) (ref 1.0–2.5)
ALT: 44 U/L (ref 9–46)
AST: 31 U/L (ref 10–40)
Albumin: 4.6 g/dL (ref 3.6–5.1)
Alkaline phosphatase (APISO): 88 U/L (ref 36–130)
BUN: 8 mg/dL (ref 7–25)
CO2: 28 mmol/L (ref 20–32)
Calcium: 9.6 mg/dL (ref 8.6–10.3)
Chloride: 100 mmol/L (ref 98–110)
Creat: 1.05 mg/dL (ref 0.60–1.35)
GFR, Est African American: 105 mL/min/{1.73_m2} (ref >=60)
GFR, Est Non African American: 91 mL/min/{1.73_m2} (ref >=60)
Globulin: 2.7 g/dL (calc) (ref 1.9–3.7)
Glucose, Bld: 79 mg/dL (ref 65–99)
Potassium: 4.2 mmol/L (ref 3.5–5.3)
Sodium: 137 mmol/L (ref 135–146)
Total Bilirubin: 0.8 mg/dL (ref 0.2–1.2)
Total Protein: 7.3 g/dL (ref 6.1–8.1)

## 2019-07-24 LAB — TESTOSTERONE: Testosterone: 323 ng/dL (ref 250–827)

## 2019-07-24 LAB — MICROALBUMIN / CREATININE URINE RATIO
Creatinine, Urine: 29 mg/dL (ref 20–320)
Microalb, Ur: <0.2 mg/dL

## 2019-07-24 LAB — INSULIN, RANDOM: Insulin: 16.7 u[IU]/mL

## 2019-07-24 LAB — VITAMIN D 25 HYDROXY (VIT D DEFICIENCY, FRACTURES): Vit D, 25-Hydroxy: 19 ng/mL — ABNORMAL LOW (ref 30–100)

## 2019-07-24 LAB — HEMOGLOBIN A1C
Hgb A1c MFr Bld: 5.1 % of total Hgb (ref <5.7)
Mean Plasma Glucose: 100 (calc)
eAG (mmol/L): 5.5 (calc)

## 2019-07-24 LAB — IRON, TOTAL/TOTAL IRON BINDING CAP: Iron: 101 ug/dL (ref 50–180)

## 2019-07-24 LAB — TSH: TSH: 1.37 mIU/L (ref 0.40–4.50)

## 2019-07-24 LAB — VITAMIN B12: Vitamin B-12: 394 pg/mL (ref 200–1100)

## 2019-07-24 NOTE — Progress Notes (Signed)
========================================================== ° °-    Iron levels Normal / OK  ==========================================================  -  Vitamin B12 = 394 is a little low  (Ideal or goa,l is between 450 - 1,100)  - and low Vitamin B12 may be associated with Anemia, Fatigue, Peripheral Neuropathy, Dementia and Depression  - So Recommend that you take Vitamin B12 tabs 500 - 1,000 mcg tablets that you take sub-lingually  which means that you take the form that you dissolve under your tongue EVERY DAY ! ==========================================================  -  Total Chol = 213 - elevate   (Ideal or goal is less than 180)  - Recommend a stricter low cholesterol diet, so......... You won't have to start meds for Cholesterol  - Cholesterol only comes from animal sources  - ie. meat, dairy, egg yolks  - Eat all the vegetables you want.  - Avoid meat, especially red meat - Beef AND Pork .  - Avoid cheese & dairy - milk & ice cream.     - Cheese is the most concentrated form of trans-fats which  is the worst thing to clog up our arteries.   - Veggie cheese is OK which can be found in the fresh  produce section at [employer] or [employer] or [employer] ==========================================================  -  Also, your  Triglycerides (   496   ) or fats in blood are way  too high  (goal is less than 150)    - Recommend avoid fried & greasy foods,  sweets / candy,   - Avoid white rice  (brown or wild rice or Quinoa is OK),   - Avoid white potatoes  (sweet potatoes are OK)   - Avoid anything made from white flour  - bagels, doughnuts, rolls, buns, biscuits, white and   wheat breads, pizza crust and traditional  pasta made of white flour & egg white  - (vegetarian pasta or spinach or wheat pasta is OK).    - Multi-grain bread is OK - like multi-grain flat bread or  sandwich thins.   - Avoid alcohol in excess.   - Exercise is also  important. ==========================================================  -  A1c - is Normal - Great - No Diabetes ==========================================================  -  Vitamin D = 19 - is EXTREMELY LOW  -- Vitamin D goal is between 70-100.   - Please take Vitamin D 10,000 units /day (Vit D 5,000 unit caps x 2 caps = 10,000 units)   - It is very important as a natural anti-inflammatory and helping the  immune system protect against viral infections, like the Covid-19    helping hair, skin, and nails, as well as reducing stroke and heart attack risk.   - It helps your bones and helps with mood.  - It also decreases numerous cancer risks so please take it as directed.   - Low Vit D is associated with a 200-300% higher risk for CANCER   and 200-300% higher risk for HEART   ATTACK  &  STROKE.    - It is also associated with higher death rate at younger ages,   autoimmune diseases like Rheumatoid arthritis, Lupus, Multiple Sclerosis.     - Also many other serious conditions, like depression, Alzheimer's  Dementia, infertility, muscle aches, fatigue, fibromyalgia - just to name a few.  ==========================================================  -  All Else - CBC - Kidneys - Electrolytes - Liver - Magnesium & Thyroid    - all  Normal / OK ====================================================

## 2019-08-09 ENCOUNTER — Other Ambulatory Visit: Payer: Self-pay | Admitting: Internal Medicine

## 2019-08-09 DIAGNOSIS — K219 Gastro-esophageal reflux disease without esophagitis: Secondary | ICD-10-CM

## 2019-08-27 ENCOUNTER — Other Ambulatory Visit: Payer: Self-pay | Admitting: Internal Medicine

## 2019-10-02 ENCOUNTER — Other Ambulatory Visit: Payer: Self-pay | Admitting: Internal Medicine

## 2019-10-09 ENCOUNTER — Other Ambulatory Visit: Payer: Self-pay | Admitting: *Deleted

## 2019-10-09 DIAGNOSIS — F32A Depression, unspecified: Secondary | ICD-10-CM

## 2019-10-09 MED ORDER — BUPROPION HCL ER (XL) 300 MG PO TB24: ORAL_TABLET | ORAL | 0 refills | Status: DC

## 2019-11-29 ENCOUNTER — Other Ambulatory Visit: Payer: Self-pay | Admitting: Adult Health

## 2019-12-12 ENCOUNTER — Other Ambulatory Visit: Payer: Self-pay | Admitting: Internal Medicine

## 2019-12-12 DIAGNOSIS — K219 Gastro-esophageal reflux disease without esophagitis: Secondary | ICD-10-CM

## 2019-12-12 MED ORDER — OMEPRAZOLE 40 MG PO CPDR: DELAYED_RELEASE_CAPSULE | ORAL | 0 refills | Status: DC

## 2020-01-10 ENCOUNTER — Other Ambulatory Visit: Payer: Self-pay | Admitting: Adult Health Nurse Practitioner

## 2020-01-10 ENCOUNTER — Other Ambulatory Visit: Payer: Self-pay | Admitting: Internal Medicine

## 2020-01-10 DIAGNOSIS — F32A Depression, unspecified: Secondary | ICD-10-CM

## 2020-01-12 ENCOUNTER — Other Ambulatory Visit: Payer: Self-pay | Admitting: *Deleted

## 2020-01-12 DIAGNOSIS — K219 Gastro-esophageal reflux disease without esophagitis: Secondary | ICD-10-CM

## 2020-01-12 DIAGNOSIS — F32A Depression, unspecified: Secondary | ICD-10-CM

## 2020-01-12 MED ORDER — ESCITALOPRAM OXALATE 20 MG PO TABS: ORAL_TABLET | ORAL | 1 refills | Status: DC

## 2020-01-12 MED ORDER — BUPROPION HCL ER (XL) 300 MG PO TB24
ORAL_TABLET | ORAL | 1 refills | Status: DC
Start: 2020-01-12 — End: 2020-03-10

## 2020-01-12 MED ORDER — BISOPROLOL-HYDROCHLOROTHIAZIDE 5-6.25 MG PO TABS: ORAL_TABLET | ORAL | 0 refills | Status: DC

## 2020-01-12 MED ORDER — OMEPRAZOLE 40 MG PO CPDR: DELAYED_RELEASE_CAPSULE | ORAL | 0 refills | Status: DC

## 2020-02-04 ENCOUNTER — Ambulatory Visit: Payer: 59 | Admitting: Adult Health Nurse Practitioner

## 2020-02-05 ENCOUNTER — Ambulatory Visit: Payer: 59 | Admitting: Adult Health Nurse Practitioner

## 2020-02-05 ENCOUNTER — Encounter: Payer: Self-pay | Admitting: Adult Health Nurse Practitioner

## 2020-02-05 ENCOUNTER — Other Ambulatory Visit: Payer: Self-pay

## 2020-02-05 VITALS — BP 128/88 | HR 76 | Temp 97.6°F | Ht 73.0 in | Wt 246.0 lb

## 2020-02-05 DIAGNOSIS — E782 Mixed hyperlipidemia: Secondary | ICD-10-CM | POA: Diagnosis not present

## 2020-02-05 DIAGNOSIS — E559 Vitamin D deficiency, unspecified: Secondary | ICD-10-CM | POA: Diagnosis not present

## 2020-02-05 DIAGNOSIS — I10 Essential (primary) hypertension: Secondary | ICD-10-CM | POA: Diagnosis not present

## 2020-02-05 DIAGNOSIS — K219 Gastro-esophageal reflux disease without esophagitis: Secondary | ICD-10-CM | POA: Diagnosis not present

## 2020-02-05 DIAGNOSIS — E349 Endocrine disorder, unspecified: Secondary | ICD-10-CM

## 2020-02-05 DIAGNOSIS — F32A Depression, unspecified: Secondary | ICD-10-CM

## 2020-02-05 DIAGNOSIS — R7309 Other abnormal glucose: Secondary | ICD-10-CM

## 2020-02-05 DIAGNOSIS — E538 Deficiency of other specified B group vitamins: Secondary | ICD-10-CM

## 2020-02-05 NOTE — Progress Notes (Signed)
FOLLOW UP 3 MONTH   Assessment and Plan:   Peter Le was seen today for follow-up.  Diagnoses and all orders for this visit:  Essential hypertension Continue current medications: bisoprol,ol 5-6.25mg  Monitor blood pressure at home; call if consistently over 130/80 Continue DASH diet.   Reminder to go to the ER if any CP, SOB, nausea, dizziness, severe HA, changes vision/speech, left arm numbness and tingling and jaw pain. -     CBC with Differential/Platelet -     COMPLETE METABOLIC PANEL WITH GFR  Hyperlipidemia, mixed -     Lipid panel  Gastroesophageal reflux disease without esophagitis Doing well at this time Continue: Omeprazole 40mg  in the morning and Famotadine 40mg  nightly Diet discussed Monitor for triggers Avoid food with high acid content Avoid excessive cafeine Increase water intake  Vitamin D deficiency Continue supplementation to maintain goal of 70-100 Not taking Vitamin D Defer vitamin D level  Vitamin B12 deficiency Continue supplementation  Abnormal glucose Discussed dietary and exercise modifications  Testosterone deficiency No replacements at this time Discussed zinc supplementation  Depression, unspecified depression type Continue medications: bupropion 300mg  daily, escitalopram 20mg  daily Discussed stress management techniques  Discussed, increase water,intake & good sleep hygiene  Discussed increasing exercise & vegetables in diet     Continue diet and meds as discussed. Further disposition pending results of labs. Discussed med's effects and SE's.  Patient agrees with plan of care and opportunity to ask questions/voice concerns. Over 30 minutes of chart review, face to face interview, exam, counseling, and critical decision making was performed.   Future Appointments  Date Time Provider Department Center  07/22/2020  2:00 PM , MD [facility] None     ----------------------------------------------------------------------------------------------------------------------  HPI 35 y.o. male  presents for 3 month follow up on HTN, HLD, abnormal glucose with history of pre-diabetes, weight and vitamin D deficiency.   He has three children two boys and one girl,  109, 46 (girl), 3.  BMI is Body mass index is 32.46 kg/m., he has not been working on diet and exercise. Wt Readings from Last 3 Encounters:  02/05/20 246 lb (111.6 kg)  07/23/19 240 lb 6.4 oz (109 kg)  03/21/18 234 lb (106.1 kg)      His blood pressure has been controlled at home, today their BP is BP: 128/88  He does not workout. He denies any cardiac symptoms, chest pains, palpitations, shortness of breath, dizziness or lower extremity edema.      He is not on cholesterol medication  and denies myalgias.   His cholesterol is not at goal. The cholesterol last visit was:   Lab Results  Component Value Date   CHOL 213 (H) 07/23/2019   HDL 34 (L) 07/23/2019   LDLCALC  07/23/2019     Comment:     . LDL cholesterol not calculated. Triglyceride levels greater than 400 mg/dL invalidate calculated LDL results. . Reference range: <100 . Desirable range <100 mg/dL for primary prevention;   <70 mg/dL for patients with CHD or diabetic patients  with > or = 2 CHD risk factors. 03/23/18 LDL-C is now calculated using the Martin-Hopkins  calculation, which is a validated novel method providing  better accuracy than the Friedewald equation in the  estimation of LDL-C.  07/25/2019 et al. 07/25/2019. 07/25/2019): 2061-2068  (http://education.[facility].com/faq/FAQ164)    TRIG 496 (H) 07/23/2019   CHOLHDL 6.3 (H) 07/23/2019    He has been working on diet and exercise for prediabetes, and denies polydipsia, polyuria, visual disturbances, vomiting  and weight loss. Last A1C in the office was:  Lab Results  Component Value Date   HGBA1C 5.1 07/23/2019     Patient does not have  history of CKD.  Their last GFR was:  Lab Results  Component Value Date   GFRNONAA 91 07/23/2019   Lab Results  Component Value Date   GFRAA 105 07/23/2019     Patient is on Vitamin D supplement for deficiency ( 19, 02/2020 ) Lab Results  Component Value Date   VD25OH 19 (L) 07/23/2019        Current Medications:  Current Outpatient Medications on File Prior to Visit  Medication Sig  . bisoprolol-hydrochlorothiazide (ZIAC) 5-6.25 MG tablet Take       1 tablet      Daily       for BP  . buPROPion (WELLBUTRIN XL) 300 MG 24 hr tablet TAKE 1 TABLET BY MOUTH EVERY MORNING FOR MOOD OR FOCUS OR CONCENTRATION  . escitalopram (LEXAPRO) 20 MG tablet TAKE 1 TABLET DAILY FOR MOOD  . famotidine (PEPCID) 40 MG tablet Take 1 tablet at bedtime for Acid Reflux  . omeprazole (PRILOSEC) 40 MG capsule Take 1 capsule Daily to Prevent Heartburn & Indigestion   No current facility-administered medications on file prior to visit.    Allergies:  No Known Allergies    Medical History:  Past Medical History:  Diagnosis Date  . Anxiety   . GERD (gastroesophageal reflux disease)   . HTN (hypertension)   . Mixed hyperlipidemia   . Unspecified vitamin D deficiency      Family history- Reviewed and unchanged Family History  Problem Relation Age of Onset  . Hypertension Father   . Colon cancer Neg Hx      Social history- Reviewed and unchanged Social History   Tobacco Use  . Smoking status: Former Smoker    Quit date: 01/03/2003    Years since quitting: 17.1  . Smokeless tobacco: Never Used  Substance Use Topics  . Alcohol use: Yes    Comment: Rarely  . Drug use: No     Names of Other Physician/Practitioners you currently use: 1. [facility] here for primary care 2. Eye Exam: Dr Dione Booze Due for 2022 3. Dental Exam: [facility]     Patient Care Team: Lucky Cowboy, MD as PCP - General (Internal Medicine) Iva Boop, MD as Consulting  Physician (Gastroenterology)    Screening Tests: Immunization History  Administered Date(s) Administered  . Influenza Inj Mdck Quad Pf 11/15/2016  . Influenza-Unspecified 10/02/2017  . PPD Test 03/21/2018, 07/23/2019  . Pneumococcal Polysaccharide-23 06/23/2010  . Tdap 06/23/2010     Vaccinations: TD or Tdap: 10/2016  Influenza: 2019  Pneumococcal: N/A SARS-COV2- Pfizer -Complete 11/2020 Shingles: Zostavax/Shingrix: N/A  Hep Panel  Preventative Care: Last colonoscopy: N/A    Review of Systems:  Review of Systems  Constitutional: Negative for chills, diaphoresis, fever, malaise/fatigue and weight loss.  HENT: Negative for congestion, ear discharge, ear pain, hearing loss, nosebleeds, sinus pain, sore throat and tinnitus.   Eyes: Negative for blurred vision, double vision, photophobia, pain, discharge and redness.  Respiratory: Negative for cough, hemoptysis, sputum production, shortness of breath, wheezing and stridor.   Cardiovascular: Negative for chest pain, palpitations, orthopnea, claudication, leg swelling and PND.  Gastrointestinal: Negative for abdominal pain, blood in stool, constipation, diarrhea, heartburn, melena, nausea and vomiting.  Genitourinary: Negative for dysuria, flank pain, frequency, hematuria and urgency.  Musculoskeletal: Negative for back pain, falls, joint  pain, myalgias and neck pain.  Skin: Negative for itching and rash.  Neurological: Negative for dizziness, tingling, tremors, sensory change, speech change, focal weakness, seizures, loss of consciousness, weakness and headaches.  Endo/Heme/Allergies: Negative for environmental allergies and polydipsia. Does not bruise/bleed easily.  Psychiatric/Behavioral: Negative for depression, hallucinations, memory loss, substance abuse and suicidal ideas. The patient is not nervous/anxious and does not have insomnia.       Physical Exam: BP 128/88   Pulse 76   Temp 97.6 F (36.4 C)   Ht 6\' 1"   (1.854 m)   Wt 246 lb (111.6 kg)   SpO2 99%   BMI 32.46 kg/m  Wt Readings from Last 3 Encounters:  02/05/20 246 lb (111.6 kg)  07/23/19 240 lb 6.4 oz (109 kg)  03/21/18 234 lb (106.1 kg)   General Appearance: Well nourished, in no apparent distress. Eyes: PERRLA, EOMs, conjunctiva no swelling or erythema Sinuses: No Frontal/maxillary tenderness ENT/Mouth: Ext aud canals clear, TMs without erythema, bulging. No erythema, swelling, or exudate on post pharynx.  Tonsils not swollen or erythematous. Hearing normal.  Neck: Supple, thyroid normal.  Respiratory: Respiratory effort normal, BS equal bilaterally without rales, rhonchi, wheezing or stridor.  Cardio: RRR with no MRGs. Brisk peripheral pulses without edema.  Abdomen: Soft, + BS.  Non tender, no guarding, rebound, hernias, masses. Lymphatics: Non tender without lymphadenopathy.  Musculoskeletal: Full ROM, 5/5 strength, Normal gait Skin: Warm, dry without rashes, lesions, ecchymosis.  Neuro: Cranial nerves intact. No cerebellar symptoms.  Psych: Awake and oriented X 3, normal affect, Insight and Judgment appropriate.    03/23/18, Elder Negus, DNP [facility] 02/05/2020   3:38 PM

## 2020-02-06 LAB — CBC WITH DIFFERENTIAL/PLATELET
Absolute Monocytes: 774 cells/uL (ref 200–950)
Basophils Absolute: 32 cells/uL (ref 0–200)
Basophils Relative: 0.4 %
Eosinophils Absolute: 87 cells/uL (ref 15–500)
Eosinophils Relative: 1.1 %
HCT: 41.6 % (ref 38.5–50.0)
Hemoglobin: 14.7 g/dL (ref 13.2–17.1)
Lymphs Abs: 2331 cells/uL (ref 850–3900)
MCH: 32.4 pg (ref 27.0–33.0)
MCHC: 35.3 g/dL (ref 32.0–36.0)
MCV: 91.6 fL (ref 80.0–100.0)
MPV: 12.3 fL (ref 7.5–12.5)
Monocytes Relative: 9.8 %
Neutro Abs: 4677 cells/uL (ref 1500–7800)
Neutrophils Relative %: 59.2 %
Platelets: 266 10*3/uL (ref 140–400)
RBC: 4.54 10*6/uL (ref 4.20–5.80)
RDW: 12.8 % (ref 11.0–15.0)
Total Lymphocyte: 29.5 %
WBC: 7.9 10*3/uL (ref 3.8–10.8)

## 2020-02-06 LAB — COMPLETE METABOLIC PANEL WITH GFR
AG Ratio: 2 (calc) (ref 1.0–2.5)
ALT: 60 U/L — ABNORMAL HIGH (ref 9–46)
AST: 35 U/L (ref 10–40)
Albumin: 4.5 g/dL (ref 3.6–5.1)
Alkaline phosphatase (APISO): 88 U/L (ref 36–130)
BUN: 13 mg/dL (ref 7–25)
CO2: 28 mmol/L (ref 20–32)
Calcium: 9.8 mg/dL (ref 8.6–10.3)
Chloride: 101 mmol/L (ref 98–110)
Creat: 1.1 mg/dL (ref 0.60–1.35)
GFR, Est African American: 100 mL/min/{1.73_m2} (ref >=60)
GFR, Est Non African American: 86 mL/min/{1.73_m2} (ref >=60)
Globulin: 2.3 g/dL (calc) (ref 1.9–3.7)
Glucose, Bld: 77 mg/dL (ref 65–99)
Potassium: 4.3 mmol/L (ref 3.5–5.3)
Sodium: 139 mmol/L (ref 135–146)
Total Bilirubin: 0.7 mg/dL (ref 0.2–1.2)
Total Protein: 6.8 g/dL (ref 6.1–8.1)

## 2020-02-06 LAB — LIPID PANEL
Cholesterol: 210 mg/dL — ABNORMAL HIGH (ref <200)
HDL: 37 mg/dL — ABNORMAL LOW (ref >=40)
LDL Cholesterol (Calc): 120 mg/dL (calc) — ABNORMAL HIGH
Non-HDL Cholesterol (Calc): 173 mg/dL (calc) — ABNORMAL HIGH (ref <130)
Total CHOL/HDL Ratio: 5.7 (calc) — ABNORMAL HIGH (ref <5.0)
Triglycerides: 374 mg/dL — ABNORMAL HIGH (ref <150)

## 2020-02-09 ENCOUNTER — Ambulatory Visit: Payer: 59 | Admitting: Adult Health Nurse Practitioner

## 2020-03-10 ENCOUNTER — Other Ambulatory Visit: Payer: Self-pay

## 2020-03-10 DIAGNOSIS — K219 Gastro-esophageal reflux disease without esophagitis: Secondary | ICD-10-CM

## 2020-03-10 DIAGNOSIS — F32A Depression, unspecified: Secondary | ICD-10-CM

## 2020-03-10 MED ORDER — BISOPROLOL-HYDROCHLOROTHIAZIDE 5-6.25 MG PO TABS: ORAL_TABLET | ORAL | 0 refills | Status: DC

## 2020-03-10 MED ORDER — ESCITALOPRAM OXALATE 20 MG PO TABS: ORAL_TABLET | ORAL | 1 refills | Status: DC

## 2020-03-10 MED ORDER — OMEPRAZOLE 40 MG PO CPDR: DELAYED_RELEASE_CAPSULE | ORAL | 0 refills | Status: DC

## 2020-03-10 MED ORDER — BUPROPION HCL ER (XL) 300 MG PO TB24
ORAL_TABLET | ORAL | 1 refills | Status: DC
Start: 2020-03-10 — End: 2020-09-08

## 2020-03-10 MED ORDER — FAMOTIDINE 40 MG PO TABS: ORAL_TABLET | ORAL | 3 refills | Status: DC

## 2020-03-10 NOTE — Telephone Encounter (Signed)
Patient has requested ALL meds be sent to new pharmacy.  [facility]

## 2020-05-11 ENCOUNTER — Encounter: Payer: 59 | Admitting: Internal Medicine

## 2020-06-04 ENCOUNTER — Other Ambulatory Visit: Payer: Self-pay | Admitting: Internal Medicine

## 2020-06-04 DIAGNOSIS — K219 Gastro-esophageal reflux disease without esophagitis: Secondary | ICD-10-CM

## 2020-06-04 DIAGNOSIS — I10 Essential (primary) hypertension: Secondary | ICD-10-CM

## 2020-06-04 MED ORDER — BISOPROLOL-HYDROCHLOROTHIAZIDE 5-6.25 MG PO TABS: ORAL_TABLET | ORAL | 3 refills | Status: DC

## 2020-06-04 MED ORDER — BISOPROLOL-HYDROCHLOROTHIAZIDE 5-6.25 MG PO TABS
ORAL_TABLET | ORAL | 3 refills | Status: DC
Start: 2020-06-04 — End: 2021-06-28

## 2020-06-04 MED ORDER — OMEPRAZOLE 40 MG PO CPDR: DELAYED_RELEASE_CAPSULE | ORAL | 3 refills | Status: DC

## 2020-07-21 NOTE — Progress Notes (Deleted)
Complete Physical  Assessment and Plan:  Discussed med's effects and SE's. Screening labs and tests as requested with regular follow-up as recommended. Over 40 minutes of exam, counseling, chart review and critical decision making was performed  HPI Patient presents for a complete physical.   His blood pressure {HAS HAS NOT:18834} been controlled at home, today their BP is   He {DOES_DOES YIF:02774} workout. He denies chest pain, shortness of breath, dizziness.  He {ACTION; IS/IS JOI:[id number]} on cholesterol medication and denies myalgias. His cholesterol {ACTION; IS/IS NOT:21021397} at goal. The cholesterol last visit was:   Lab Results  Component Value Date   CHOL 210 (H) 02/05/2020   HDL 37 (L) 02/05/2020   LDLCALC 120 (H) 02/05/2020   TRIG 374 (H) 02/05/2020   CHOLHDL 5.7 (H) 02/05/2020   He {Has/has not:18111} been working on diet and exercise for ***prediabetes, he {ACTION; IS/IS NOT:21021397} on bASA, he {ACTION; IS/IS NOB:09628366} on ACE/ARB and denies {Symptoms; diabetes Peter/o none:19199}. Last A1C in the office was:  Lab Results  Component Value Date   HGBA1C 5.1 07/23/2019   Last GFR:   Lab Results  Component Value Date   GFRNONAA 86 02/05/2020     Lab Results  Component Value Date   GFRAA 100 02/05/2020   Patient is on Vitamin D supplement.   Lab Results  Component Value Date   VD25OH 19 (L) 07/23/2019     Last PSA was: No results found for: PSA  Current Medications:  Current Outpatient Medications on File Prior to Visit  Medication Sig Dispense Refill   bisoprolol-hydrochlorothiazide (ZIAC) 5-6.25 MG tablet Take  1 tablet  Daily  for BP 90 tablet 3   buPROPion (WELLBUTRIN XL) 300 MG 24 hr tablet TAKE 1 TABLET BY MOUTH EVERY MORNING FOR MOOD OR FOCUS OR CONCENTRATION 90 tablet 1   escitalopram (LEXAPRO) 20 MG tablet TAKE 1 TABLET DAILY FOR MOOD 90 tablet 1   famotidine (PEPCID) 40 MG tablet Take 1 tablet at bedtime for Acid Reflux 90 tablet 3    omeprazole (PRILOSEC) 40 MG capsule Take  1 capsule  Daily  to Prevent Heartburn & Indigestion 90 capsule 3   No current facility-administered medications on file prior to visit.   Allergies:  No Known Allergies Health Maintenance:  Immunization History  Administered Date(s) Administered   Influenza Inj Mdck Quad Pf 11/15/2016   Influenza-Unspecified 10/02/2017   PPD Test 03/21/2018, 07/23/2019   Pneumococcal Polysaccharide-23 06/23/2010   Tdap 06/23/2010    Tetanus: due *** Pneumovax:N/A Prevnar 13:N/A Flu vaccine: *** Zostavax:N/A  DEXA: Colonoscopy: Age 77  Eye Exam: Dentist:  Patient Care Team: Lucky Cowboy, MD as PCP - General (Internal Medicine) Iva Boop, MD as Consulting Physician (Gastroenterology)  Medical History:  has Obesity; Labile hypertension; Hyperlipidemia, mixed; Testosterone deficiency; Vitamin B12 deficiency; and Vitamin D deficiency on their problem list. Surgical History:  He  has a past surgical history that includes Hernia repair (age 33) and Cholecystectomy (2008). Family History:  His family history includes Hypertension in his father. Social History:   reports that he quit smoking about 17 years ago. He has never used smokeless tobacco. He reports current alcohol use. He reports that he does not use drugs. Review of Systems:  ROS  Physical Exam: Estimated body mass index is 32.46 kg/m as calculated from the following:   Height as of 02/05/20: 6\' 1"  (1.854 m).   Weight as of 02/05/20: 246 lb (111.6 kg). There were no vitals taken for this visit.  General Appearance: Well nourished, in no apparent distress.  Eyes: PERRLA, EOMs, conjunctiva no swelling or erythema, normal fundi and vessels.  Sinuses: No Frontal/maxillary tenderness  ENT/Mouth: Ext aud canals clear, normal light reflex with TMs without erythema, bulging. Good dentition. No erythema, swelling, or exudate on post pharynx. Tonsils not swollen or erythematous. Hearing  normal.  Neck: Supple, thyroid normal. No bruits  Respiratory: Respiratory effort normal, BS equal bilaterally without rales, rhonchi, wheezing or stridor.  Cardio: RRR without murmurs, rubs or gallops. Brisk peripheral pulses without edema.  Chest: symmetric, with normal excursions and percussion.  Abdomen: Soft, nontender, no guarding, rebound, hernias, masses, or organomegaly.  Lymphatics: Non tender without lymphadenopathy.  Genitourinary:  Musculoskeletal: Full ROM all peripheral extremities,5/5 strength, and normal gait.  Skin: Warm, dry without rashes, lesions, ecchymosis. Neuro: Cranial nerves intact, reflexes equal bilaterally. Normal muscle tone, no cerebellar symptoms. Sensation intact.  Psych: Awake and oriented X 3, normal affect, Insight and Judgment appropriate.   EKG: WNL no changes.   Peter Le Peter Le 1:57 PM [facility]

## 2020-07-22 ENCOUNTER — Encounter: Payer: 59 | Admitting: Internal Medicine

## 2020-07-22 ENCOUNTER — Encounter: Payer: 59 | Admitting: Nurse Practitioner

## 2020-07-22 DIAGNOSIS — K219 Gastro-esophageal reflux disease without esophagitis: Secondary | ICD-10-CM

## 2020-07-22 DIAGNOSIS — Z136 Encounter for screening for cardiovascular disorders: Secondary | ICD-10-CM

## 2020-07-22 DIAGNOSIS — E782 Mixed hyperlipidemia: Secondary | ICD-10-CM

## 2020-07-22 DIAGNOSIS — I10 Essential (primary) hypertension: Secondary | ICD-10-CM

## 2020-07-22 DIAGNOSIS — E559 Vitamin D deficiency, unspecified: Secondary | ICD-10-CM

## 2020-07-22 DIAGNOSIS — Z79899 Other long term (current) drug therapy: Secondary | ICD-10-CM

## 2020-07-22 DIAGNOSIS — Z0001 Encounter for general adult medical examination with abnormal findings: Secondary | ICD-10-CM

## 2020-07-22 DIAGNOSIS — F32A Depression, unspecified: Secondary | ICD-10-CM

## 2020-07-22 DIAGNOSIS — E349 Endocrine disorder, unspecified: Secondary | ICD-10-CM

## 2020-07-22 DIAGNOSIS — Z87891 Personal history of nicotine dependence: Secondary | ICD-10-CM

## 2020-07-22 DIAGNOSIS — Z1329 Encounter for screening for other suspected endocrine disorder: Secondary | ICD-10-CM

## 2020-07-22 DIAGNOSIS — Z131 Encounter for screening for diabetes mellitus: Secondary | ICD-10-CM

## 2020-09-08 ENCOUNTER — Other Ambulatory Visit: Payer: Self-pay

## 2020-09-08 ENCOUNTER — Other Ambulatory Visit: Payer: Self-pay | Admitting: Internal Medicine

## 2020-09-08 DIAGNOSIS — F32A Depression, unspecified: Secondary | ICD-10-CM

## 2020-09-08 MED ORDER — BUPROPION HCL ER (XL) 300 MG PO TB24: ORAL_TABLET | ORAL | 0 refills | Status: DC

## 2020-09-08 MED ORDER — ESCITALOPRAM OXALATE 20 MG PO TABS: ORAL_TABLET | ORAL | 0 refills | Status: DC

## 2020-09-08 MED ORDER — BENZONATATE 200 MG PO CAPS: ORAL_CAPSULE | ORAL | 1 refills | Status: DC

## 2020-09-08 MED ORDER — DEXAMETHASONE 4 MG PO TABS: ORAL_TABLET | ORAL | 0 refills | Status: DC

## 2020-09-08 MED ORDER — PROMETHAZINE-DM 6.25-15 MG/5ML PO SYRP: ORAL_SOLUTION | ORAL | 1 refills | Status: DC

## 2020-09-08 MED ORDER — AZITHROMYCIN 250 MG PO TABS: ORAL_TABLET | ORAL | 1 refills | Status: DC

## 2020-09-27 ENCOUNTER — Other Ambulatory Visit: Payer: Self-pay | Admitting: Internal Medicine

## 2020-09-27 MED ORDER — DEXAMETHASONE 4 MG PO TABS: ORAL_TABLET | ORAL | 0 refills | Status: DC

## 2020-09-27 MED ORDER — PSEUDOEPHEDRINE HCL ER 120 MG PO TB12: ORAL_TABLET | ORAL | 3 refills | Status: DC

## 2020-10-10 ENCOUNTER — Other Ambulatory Visit: Payer: Self-pay | Admitting: Adult Health

## 2020-10-10 ENCOUNTER — Other Ambulatory Visit: Payer: Self-pay | Admitting: Internal Medicine

## 2020-10-10 DIAGNOSIS — F32A Depression, unspecified: Secondary | ICD-10-CM

## 2020-10-10 MED ORDER — BUPROPION HCL ER (XL) 300 MG PO TB24: ORAL_TABLET | ORAL | 0 refills | Status: DC

## 2020-10-14 NOTE — Progress Notes (Signed)
FOLLOW UP  Assessment and Plan:   Hypertension Well controlled with current medications  Monitor blood pressure at home; patient to call if consistently greater than 130/80 Continue DASH diet.   Reminder to go to the ER if any CP, SOB, nausea, dizziness, severe HA, changes vision/speech, left arm numbness and tingling and jaw pain.  Hyperlipidemia Currently not at goal; Will recheck today and if remains elevated will start Crestor 5 mg daily Continue low cholesterol diet and exercise.  Check lipid panel.   Abnormal glucose Continue medication: Continue diet and exercise.  Perform daily foot/skin check, notify office of any concerning changes.  Check A1C  GERD Continue Famotidine and behavior modifications  Depression Currently not controlled with Lexapro and wellbutrin D/C Lexapro and begin Effexor 75mg  QD Denies suicidal/homicidal ideation Monitor symptoms and if they are not improving with new medication call the office Follow up in 3 months  Bilateral knee Pain Pt is sitting long periods Advised use of standing desk, more walking and Fish oil Omega 3 If symptoms persist call and will get imaging  Medication Management Magnesium  Vitamin D Def Currently taking Vit D 2000 units 3 tabs daily Check Vit D level   Continue diet and meds as discussed. Further disposition pending results of labs. Discussed med's effects and SE's.   Over 30 minutes of exam, counseling, chart review, and critical decision making was performed.   Future Appointments  Date Time Provider Department Center  02/17/2021  2:30 PM 02/19/2021, NP [facility] None    ----------------------------------------------------------------------------------------------------------------------  HPI 35 y.o. male  presents for 3 month follow up on hypertension, cholesterol, diabetes, weight and vitamin D deficiency.   BMI is Body mass index is 32.06 kg/m., he has not been working on diet and  exercise. Wt Readings from Last 3 Encounters:  10/15/20 243 lb (110.2 kg)  02/05/20 246 lb (111.6 kg)  07/23/19 240 lb 6.4 oz (109 kg)    His blood pressure has been controlled at home, today their BP is BP: 110/76 BP Readings from Last 3 Encounters:  10/15/20 110/76  02/05/20 128/88  07/23/19 120/84     He does not workout. He denies chest pain, shortness of breath, dizziness.   He is not on cholesterol medication . His cholesterol is not at goal. The cholesterol last visit was:   Lab Results  Component Value Date   CHOL 210 (H) 02/05/2020   HDL 37 (L) 02/05/2020   LDLCALC 120 (H) 02/05/2020   TRIG 374 (H) 02/05/2020   CHOLHDL 5.7 (H) 02/05/2020    He has been working on diet and exercise for abnormal glucose. Last A1C in the office was:  Lab Results  Component Value Date   HGBA1C 5.1 07/23/2019   Patient is on Vitamin D supplement.   Lab Results  Component Value Date   VD25OH 19 (L) 07/23/2019        Current Medications:  Current Outpatient Medications on File Prior to Visit  Medication Sig   bisoprolol-hydrochlorothiazide (ZIAC) 5-6.25 MG tablet Take  1 tablet  Daily  for BP   buPROPion (WELLBUTRIN XL) 300 MG 24 hr tablet Take  1 tablet  every Morning for Mood, Focus & Concentration                       /     TAKE ONE TABLET BY MOUTH EVERY MORNING FOR MOOD OR FOCUS OR CONCENTRATION   famotidine (PEPCID) 40 MG tablet Take 1  tablet at bedtime for Acid Reflux   omeprazole (PRILOSEC) 40 MG capsule Take  1 capsule  Daily  to Prevent Heartburn & Indigestion   No current facility-administered medications on file prior to visit.     Allergies: No Known Allergies   Medical History:  Past Medical History:  Diagnosis Date   Anxiety    GERD (gastroesophageal reflux disease)    HTN (hypertension)    Mixed hyperlipidemia    Unspecified vitamin D deficiency    Family history- Reviewed and unchanged Social history- Reviewed and unchanged   Review of Systems:   Review of Systems  Constitutional:  Negative for chills, fever and weight loss.  HENT:  Negative for congestion and hearing loss.   Eyes:  Negative for blurred vision and double vision.  Respiratory:  Negative for cough and shortness of breath.   Cardiovascular:  Negative for chest pain, palpitations, orthopnea and leg swelling.  Gastrointestinal:  Negative for abdominal pain, constipation, diarrhea, heartburn, nausea and vomiting.  Musculoskeletal:  Positive for joint pain (knees). Negative for falls and myalgias.  Skin:  Negative for rash.  Neurological:  Negative for dizziness, tingling, tremors, loss of consciousness and headaches.  Psychiatric/Behavioral:  Positive for depression. Negative for memory loss and suicidal ideas.      Physical Exam: BP 110/76   Pulse 68   Temp 97.9 F (36.6 C)   Wt 243 lb (110.2 kg)   SpO2 99%   BMI 32.06 kg/m  Wt Readings from Last 3 Encounters:  10/15/20 243 lb (110.2 kg)  02/05/20 246 lb (111.6 kg)  07/23/19 240 lb 6.4 oz (109 kg)   General Appearance: Well nourished, in no apparent distress. Eyes: PERRLA, EOMs, conjunctiva no swelling or erythema Sinuses: No Frontal/maxillary tenderness ENT/Mouth: Ext aud canals clear, TMs without erythema, bulging. No erythema, swelling, or exudate on post pharynx.  Tonsils not swollen or erythematous. Hearing normal.  Neck: Supple, thyroid normal.  Respiratory: Respiratory effort normal, BS equal bilaterally without rales, rhonchi, wheezing or stridor.  Cardio: RRR with no MRGs. Brisk peripheral pulses without edema.  Abdomen: Soft, + BS.  Non tender, no guarding, rebound, hernias, masses. Lymphatics: Non tender without lymphadenopathy.  Musculoskeletal: Full ROM, 5/5 strength, Normal gait. Left knee crepitus with extension.  Skin: Warm, dry without rashes, lesions, ecchymosis.  Neuro: Cranial nerves intact. No cerebellar symptoms.  Psych: Awake and oriented X 3, normal affect, Insight and Judgment  appropriate.    Revonda Humphrey, NP 11:53 AM [facility]

## 2020-10-15 ENCOUNTER — Other Ambulatory Visit: Payer: Self-pay

## 2020-10-15 ENCOUNTER — Ambulatory Visit: Payer: 59 | Admitting: Nurse Practitioner

## 2020-10-15 ENCOUNTER — Encounter: Payer: Self-pay | Admitting: Nurse Practitioner

## 2020-10-15 VITALS — BP 110/76 | HR 68 | Temp 97.9°F | Wt 243.0 lb

## 2020-10-15 DIAGNOSIS — R7309 Other abnormal glucose: Secondary | ICD-10-CM

## 2020-10-15 DIAGNOSIS — E782 Mixed hyperlipidemia: Secondary | ICD-10-CM

## 2020-10-15 DIAGNOSIS — Z79899 Other long term (current) drug therapy: Secondary | ICD-10-CM

## 2020-10-15 DIAGNOSIS — I10 Essential (primary) hypertension: Secondary | ICD-10-CM | POA: Diagnosis not present

## 2020-10-15 DIAGNOSIS — M25562 Pain in left knee: Secondary | ICD-10-CM

## 2020-10-15 DIAGNOSIS — M25561 Pain in right knee: Secondary | ICD-10-CM

## 2020-10-15 DIAGNOSIS — K219 Gastro-esophageal reflux disease without esophagitis: Secondary | ICD-10-CM

## 2020-10-15 DIAGNOSIS — E559 Vitamin D deficiency, unspecified: Secondary | ICD-10-CM

## 2020-10-15 DIAGNOSIS — F32A Depression, unspecified: Secondary | ICD-10-CM

## 2020-10-15 MED ORDER — VENLAFAXINE HCL ER 75 MG PO CP24
75.0000 mg | ORAL_CAPSULE | Freq: Every day | ORAL | 2 refills | Status: DC
Start: 2020-10-15 — End: 2021-01-16

## 2020-10-15 NOTE — Progress Notes (Signed)
FOLLOW UP  Assessment and Plan:   Hypertension Well controlled with current medications  Monitor blood pressure at home; patient to call if consistently greater than 130/80 Continue DASH diet.   Reminder to go to the ER if any CP, SOB, nausea, dizziness, severe HA, changes vision/speech, left arm numbness and tingling and jaw pain. CBC  Hyperlipidemia Currently not at goal; If cholesterol remains elevated with this lab will start Crestor 5 mg daily Continue low cholesterol diet and exercise.  Check lipid panel.  CMP  TSH  Abnormal glucose Continue diet and exercise.  Perform daily foot/skin check, notify office of any concerning changes.  Check A1C  GERD Continue Famotidine and behavior modifications  Depression Currently not well controlled on Lexapro and Wellbutrin D/C Lexapro and Start Effexor 75 mg daily with Wellbutrin Follow up in 3 months or sooner if meds are not controlling depression Denies thoughts of hurting self or others  Medication Management Magnesium  Knee pain bilaterally Knees are hurting after long periods of sitting.  Advised use of fish oil, standing desk, More walking If pain persists will order imaging.  Vitamin D Def Last visit was low, is taking Vit D3 2000 units 3 tabs daily Check Vit D level   Continue diet and meds as discussed. Further disposition pending results of labs. Discussed med's effects and SE's.   Over 30 minutes of exam, counseling, chart review, and critical decision making was performed.   No future appointments.   ----------------------------------------------------------------------------------------------------------------------  HPI 35 y.o. male  presents for 3 month follow up on hypertension, cholesterol, diabetes, weight and vitamin D deficiency.   BMI is Body mass index is 32.06 kg/m., he has been working on diet and exercise. Wt Readings from Last 3 Encounters:  10/15/20 243 lb (110.2 kg)  02/05/20 246 lb  (111.6 kg)  07/23/19 240 lb 6.4 oz (109 kg)    His blood pressure has been controlled at home, today their BP is BP: 110/76 BP Readings from Last 3 Encounters:  10/15/20 110/76  02/05/20 128/88  07/23/19 120/84    He is currently using Lexapro and Wellbutrin but has noticed more irritability, has been not wanting to do as much feels low at times.    He does workout. He denies chest pain, shortness of breath, dizziness.   He is not on cholesterol medication . His cholesterol is not at goal. The cholesterol last visit was:   Lab Results  Component Value Date   CHOL 210 (H) 02/05/2020   HDL 37 (L) 02/05/2020   LDLCALC 120 (H) 02/05/2020   TRIG 374 (H) 02/05/2020   CHOLHDL 5.7 (H) 02/05/2020    He has been working on diet and exercise for abnormal glucose. Last A1C in the office was:  Lab Results  Component Value Date   HGBA1C 5.1 07/23/2019   Patient is on Vitamin D supplement.  Is taking Vit D3 2000 uinits 3 daily Lab Results  Component Value Date   VD25OH 19 (L) 07/23/2019        Current Medications:  Current Outpatient Medications on File Prior to Visit  Medication Sig   bisoprolol-hydrochlorothiazide (ZIAC) 5-6.25 MG tablet Take  1 tablet  Daily  for BP   buPROPion (WELLBUTRIN XL) 300 MG 24 hr tablet Take  1 tablet  every Morning for Mood, Focus & Concentration                       /     TAKE  ONE TABLET BY MOUTH EVERY MORNING FOR MOOD OR FOCUS OR CONCENTRATION   escitalopram (LEXAPRO) 20 MG tablet Take  1 tablet  Daily  for Mood                       /     TAKE ONE TABLET BY MOUTH DAILY FOR MOOD   famotidine (PEPCID) 40 MG tablet Take 1 tablet at bedtime for Acid Reflux   omeprazole (PRILOSEC) 40 MG capsule Take  1 capsule  Daily  to Prevent Heartburn & Indigestion   No current facility-administered medications on file prior to visit.     Allergies: No Known Allergies   Medical History:  Past Medical History:  Diagnosis Date   Anxiety    GERD (gastroesophageal  reflux disease)    HTN (hypertension)    Mixed hyperlipidemia    Unspecified vitamin D deficiency    Family history- Reviewed and unchanged Social history- Reviewed and unchanged   Review of Systems:  Review of Systems  Constitutional:  Negative for chills, fever and weight loss.  HENT:  Negative for congestion and hearing loss.   Eyes:  Negative for blurred vision and double vision.  Respiratory:  Negative for cough and shortness of breath.   Cardiovascular:  Negative for chest pain, palpitations, orthopnea and leg swelling.  Gastrointestinal:  Negative for abdominal pain, constipation, diarrhea, heartburn, nausea and vomiting.  Musculoskeletal:  Positive for joint pain (knees bilaterally). Negative for falls and myalgias.  Skin:  Negative for rash.  Neurological:  Negative for dizziness, tingling, tremors, loss of consciousness and headaches.  Psychiatric/Behavioral:  Positive for depression. Negative for memory loss and suicidal ideas.      Physical Exam: BP 110/76   Pulse 68   Temp 97.9 F (36.6 C)   Wt 243 lb (110.2 kg)   SpO2 99%   BMI 32.06 kg/m  Wt Readings from Last 3 Encounters:  10/15/20 243 lb (110.2 kg)  02/05/20 246 lb (111.6 kg)  07/23/19 240 lb 6.4 oz (109 kg)   General Appearance: Well nourished, in no apparent distress. Eyes: PERRLA, EOMs, conjunctiva no swelling or erythema Sinuses: No Frontal/maxillary tenderness ENT/Mouth: Ext aud canals clear, TMs without erythema, bulging. No erythema, swelling, or exudate on post pharynx.  Tonsils not swollen or erythematous. Hearing normal.  Neck: Supple, thyroid normal.  Respiratory: Respiratory effort normal, BS equal bilaterally without rales, rhonchi, wheezing or stridor.  Cardio: RRR with no MRGs. Brisk peripheral pulses without edema.  Abdomen: Soft, + BS.  Non tender, no guarding, rebound, hernias, masses. Lymphatics: Non tender without lymphadenopathy.  Musculoskeletal: Full ROM, 5/5 strength, Normal  gait. Some crepitus of knees with extension. Skin: Warm, dry without rashes, lesions, ecchymosis.  Neuro: Cranial nerves intact. No cerebellar symptoms.  Psych: Awake and oriented X 3, normal affect, Insight and Judgment appropriate.    Revonda Humphrey, NP 11:17 AM [facility]

## 2020-10-15 NOTE — Patient Instructions (Addendum)
Venlafaxine Extended-Release Capsules What is this medication? VENLAFAXINE (VEN la fax een) treats depression and anxiety. It increases the amount of serotonin and norepinephrine in the brain, hormones that help regulate mood. It belongs to a group of medications called SNRIs. This medicine may be used for other purposes; ask your health care provider or pharmacist if you have questions. COMMON BRAND NAME(S): Effexor XR What should I tell my care team before I take this medication? They need to know if you have any of these conditions: Bleeding disorders Glaucoma Heart disease High blood pressure High cholesterol Kidney disease Liver disease Low levels of sodium in the blood Mania or bipolar disorder Seizures Suicidal thoughts, plans, or attempt; a previous suicide attempt by you or a family Take medications that treat or prevent blood clots Thyroid disease An unusual or allergic reaction to venlafaxine, desvenlafaxine, other medications, foods, dyes, or preservatives Pregnant or trying to get pregnant Breast-feeding How should I use this medication? Take this medication by mouth with a full glass of water. Follow the directions on the prescription label. Do not cut, crush, or chew this medication. Take it with food. If needed, the capsule may be carefully opened and the entire contents sprinkled on a spoonful of cool applesauce. Swallow the applesauce/pellet mixture right away without chewing and follow with a glass of water to ensure complete swallowing of the pellets. Try to take your medication at about the same time each day. Do not take your medication more often than directed. Do not stop taking this medication suddenly except upon the advice of your care team. Stopping this medication too quickly may cause serious side effects or your condition may worsen. A special MedGuide will be given to you by the pharmacist with each prescription and refill. Be sure to read this information  carefully each time. Talk to your care team regarding the use of this medication in children. Special care may be needed. Overdosage: If you think you have taken too much of this medicine contact a poison control center or emergency room at once. NOTE: This medicine is only for you. Do not share this medicine with others. What if I miss a dose? If you miss a dose, take it as soon as you can. If it is almost time for your next dose, take only that dose. Do not take double or extra doses. What may interact with this medication? Do not take this medication with any of the following: Certain medications for fungal infections like fluconazole, itraconazole, ketoconazole, posaconazole, voriconazole Cisapride Desvenlafaxine Dronedarone Duloxetine Levomilnacipran Linezolid MAOIs like Carbex, Eldepryl, Marplan, Nardil, and Parnate Methylene blue (injected into a vein) Milnacipran Pimozide Thioridazine This medication may also interact with the following: Amphetamines Aspirin and aspirin-like medications Certain medications for depression, anxiety, or psychotic disturbances Certain medications for migraine headaches like almotriptan, eletriptan, frovatriptan, naratriptan, rizatriptan, sumatriptan, zolmitriptan Certain medications for sleep Certain medications that treat or prevent blood clots like dalteparin, enoxaparin, warfarin Cimetidine Clozapine Diuretics Fentanyl Furazolidone Indinavir Isoniazid Lithium Metoprolol NSAIDS, medications for pain and inflammation, like ibuprofen or naproxen Other medications that prolong the QT interval (cause an abnormal heart rhythm) like dofetilide, ziprasidone Procarbazine Rasagiline Supplements like St. John's wort, kava kava, valerian Tramadol Tryptophan This list may not describe all possible interactions. Give your health care provider a list of all the medicines, herbs, non-prescription drugs, or dietary supplements you use. Also tell them  if you smoke, drink alcohol, or use illegal drugs. Some items may interact with your medicine. What should   I watch for while using this medication? Tell your care team if your symptoms do not get better or if they get worse. Visit your care team for regular checks on your progress. Because it may take several weeks to see the full effects of this medication, it is important to continue your treatment as prescribed by your care team. Watch for new or worsening thoughts of suicide or depression. This includes sudden changes in mood, behaviors, or thoughts. These changes can happen at any time but are more common in the beginning of treatment or after a change in dose. Call your care team right away if you experience these thoughts or worsening depression. Manic episodes may happen in patients with bipolar disorder who take this medication. Watch for changes in feelings or behaviors such as feeling anxious, nervous, agitated, panicky, irritable, hostile, aggressive, impulsive, severely restless, overly excited and hyperactive, or trouble sleeping. These changes can happen at any time but are more common in the beginning of treatment or after a change in dose. Call your care team right away if you notice any of these symptoms. This medication can cause an increase in blood pressure. Check with your care team for instructions on monitoring your blood pressure while taking this medication. You may get drowsy or dizzy. Do not drive, use machinery, or do anything that needs mental alertness until you know how this medication affects you. Do not stand or sit up quickly, especially if you are an older patient. This reduces the risk of dizzy or fainting spells. Alcohol may interfere with the effect of this medication. Avoid alcoholic drinks. Your mouth may get dry. Chewing sugarless gum, sucking hard candy and drinking plenty of water will help. Contact your care team if the problem does not go away or is severe. What  side effects may I notice from receiving this medication? Side effects that you should report to your care team as soon as possible: Allergic reactions-skin rash, itching, hives, swelling of the face, lips, tongue, or throat Bleeding-bloody or black, tar-like stools, red or dark brown urine, vomiting blood or brown material that looks like coffee grounds, small, red or purple spots on skin, unusual bleeding or bruising Heart rhythm changes-fast or irregular heartbeat, dizziness, feeling faint or lightheaded, chest pain, trouble breathing Increase in blood pressure Loss of appetite with weight loss Low sodium level-muscle weakness, fatigue, dizziness, headache, confusion Serotonin syndrome-irritability, confusion, fast or irregular heartbeat, muscle stiffness, twitching muscles, sweating, high fever, seizures, chills, vomiting, diarrhea Sudden eye pain or change in vision such as blurry vision, seeing halos around lights, vision loss Thoughts of suicide or self-harm, worsening mood, feelings of depression Side effects that usually do not require medical attention (report to your care team if they continue or are bothersome): Anxiety, nervousness Change in sex drive or performance Dizziness Dry mouth Excessive sweating Nausea Tremors or shaking Trouble sleeping This list may not describe all possible side effects. Call your doctor for medical advice about side effects. You may report side effects to FDA at 1-800-FDA-1088. Where should I keep my medication? Keep out of the reach of children and pets. Store at a controlled temperature between 20 and 25 degrees C (68 degrees and 77 degrees F), in a dry place. Throw away any unused medication after the expiration date. NOTE: This sheet is a summary. It may not cover all possible information. If you have questions about this medicine, talk to your doctor, pharmacist, or health care provider.  2022 [employer]vier/Gold Standard (2019-11-10   14:56:43)  

## 2020-10-16 ENCOUNTER — Other Ambulatory Visit: Payer: Self-pay | Admitting: Nurse Practitioner

## 2020-10-16 DIAGNOSIS — E782 Mixed hyperlipidemia: Secondary | ICD-10-CM

## 2020-10-16 LAB — CBC WITH DIFFERENTIAL/PLATELET
Absolute Monocytes: 518 cells/uL (ref 200–950)
Basophils Absolute: 32 cells/uL (ref 0–200)
Basophils Relative: 0.5 %
Eosinophils Absolute: 122 cells/uL (ref 15–500)
Eosinophils Relative: 1.9 %
HCT: 42 % (ref 38.5–50.0)
Hemoglobin: 14.3 g/dL (ref 13.2–17.1)
Lymphs Abs: 1824 cells/uL (ref 850–3900)
MCH: 32.1 pg (ref 27.0–33.0)
MCHC: 34 g/dL (ref 32.0–36.0)
MCV: 94.2 fL (ref 80.0–100.0)
MPV: 11.9 fL (ref 7.5–12.5)
Monocytes Relative: 8.1 %
Neutro Abs: 3904 cells/uL (ref 1500–7800)
Neutrophils Relative %: 61 %
Platelets: 221 10*3/uL (ref 140–400)
RBC: 4.46 10*6/uL (ref 4.20–5.80)
RDW: 12.8 % (ref 11.0–15.0)
Total Lymphocyte: 28.5 %
WBC: 6.4 10*3/uL (ref 3.8–10.8)

## 2020-10-16 LAB — COMPLETE METABOLIC PANEL WITH GFR
AG Ratio: 1.5 (calc) (ref 1.0–2.5)
ALT: 31 U/L (ref 9–46)
AST: 21 U/L (ref 10–40)
Albumin: 4.3 g/dL (ref 3.6–5.1)
Alkaline phosphatase (APISO): 69 U/L (ref 36–130)
BUN: 8 mg/dL (ref 7–25)
CO2: 30 mmol/L (ref 20–32)
Calcium: 9.5 mg/dL (ref 8.6–10.3)
Chloride: 105 mmol/L (ref 98–110)
Creat: 1.16 mg/dL (ref 0.60–1.26)
Globulin: 2.8 g/dL (calc) (ref 1.9–3.7)
Glucose, Bld: 96 mg/dL (ref 65–99)
Potassium: 4 mmol/L (ref 3.5–5.3)
Sodium: 142 mmol/L (ref 135–146)
Total Bilirubin: 0.8 mg/dL (ref 0.2–1.2)
Total Protein: 7.1 g/dL (ref 6.1–8.1)
eGFR: 83 mL/min/{1.73_m2} (ref >=60)

## 2020-10-16 LAB — MAGNESIUM: Magnesium: 2.1 mg/dL (ref 1.5–2.5)

## 2020-10-16 LAB — TSH: TSH: 0.72 mIU/L (ref 0.40–4.50)

## 2020-10-16 LAB — HEMOGLOBIN A1C
Hgb A1c MFr Bld: 5.2 % of total Hgb (ref <5.7)
Mean Plasma Glucose: 103 mg/dL
eAG (mmol/L): 5.7 mmol/L

## 2020-10-16 LAB — LIPID PANEL
Cholesterol: 208 mg/dL — ABNORMAL HIGH (ref <200)
HDL: 36 mg/dL — ABNORMAL LOW (ref >=40)
LDL Cholesterol (Calc): 136 mg/dL (calc) — ABNORMAL HIGH
Non-HDL Cholesterol (Calc): 172 mg/dL (calc) — ABNORMAL HIGH (ref <130)
Total CHOL/HDL Ratio: 5.8 (calc) — ABNORMAL HIGH (ref <5.0)
Triglycerides: 219 mg/dL — ABNORMAL HIGH (ref <150)

## 2020-10-16 LAB — VITAMIN D 25 HYDROXY (VIT D DEFICIENCY, FRACTURES): Vit D, 25-Hydroxy: 34 ng/mL (ref 30–100)

## 2020-10-16 MED ORDER — ROSUVASTATIN CALCIUM 5 MG PO TABS
5.0000 mg | ORAL_TABLET | Freq: Every day | ORAL | 11 refills | Status: DC
Start: 2020-10-16 — End: 2021-10-21

## 2020-10-20 ENCOUNTER — Ambulatory Visit: Payer: 59 | Admitting: Internal Medicine

## 2020-11-19 ENCOUNTER — Encounter: Payer: Self-pay | Admitting: Internal Medicine

## 2020-11-19 ENCOUNTER — Other Ambulatory Visit: Payer: Self-pay | Admitting: Internal Medicine

## 2020-11-19 MED ORDER — PSEUDOEPHEDRINE HCL ER 120 MG PO TB12: ORAL_TABLET | ORAL | 3 refills | Status: DC

## 2020-11-19 MED ORDER — DEXAMETHASONE 4 MG PO TABS: ORAL_TABLET | ORAL | 0 refills | Status: DC

## 2020-12-09 ENCOUNTER — Other Ambulatory Visit: Payer: Self-pay | Admitting: Internal Medicine

## 2020-12-09 DIAGNOSIS — F32A Depression, unspecified: Secondary | ICD-10-CM

## 2020-12-09 MED ORDER — BUPROPION HCL ER (XL) 300 MG PO TB24: ORAL_TABLET | ORAL | 3 refills | Status: DC

## 2021-01-16 ENCOUNTER — Other Ambulatory Visit: Payer: Self-pay | Admitting: Nurse Practitioner

## 2021-01-16 DIAGNOSIS — F32A Depression, unspecified: Secondary | ICD-10-CM

## 2021-02-15 NOTE — Progress Notes (Signed)
FOLLOW UP  Assessment and Plan:   Hypertension Well controlled with current medications  Monitor blood pressure at home; patient to call if consistently greater than 130/80 Continue DASH diet.   Reminder to go to the ER if any CP, SOB, nausea, dizziness, severe HA, changes vision/speech, left arm numbness and tingling and jaw pain. CBC  Hyperlipidemia Currently not at goal; If cholesterol remains elevated with this lab will start Crestor 5 mg daily Continue low cholesterol diet and exercise.  Check lipid panel.  CMP  Cerumen Impaction Right ear Ear irrigated- large amount dried wax removed  Abnormal glucose Continue diet and exercise.  Perform daily foot/skin check, notify office of any concerning changes.  Last A1c's have been at goal Defer A1c  GERD Continue Famotidine and behavior modifications  Depression/Anxiety Currently not well controlled on Effexor and Wellbutrin D/c effexor and begin Prozac 20 mg Begin Buspar 5 mg TID PRN for anxiety and irritability Follow up in 1 month for reevaluation, if mood not improved will refer to psychiatry Denies thoughts of hurting self or others Instructed patient to contact office or on-call physician promptly should condition worsen or any new symptoms appear. IF THE PATIENT HAS ANY SUICIDAL OR HOMICIDAL IDEATIONS, CALL THE OFFICE, DISCUSS WITH A SUPPORT MEMBER, OR GO TO THE ER IMMEDIATELY. Patient was agreeable with this plan.   Medication Management Magnesium  Vitamin D Def Last visit was low,encourage Vit D supplementation Defer Vit D level   Continue diet and meds as discussed. Further disposition pending results of labs. Discussed med's effects and SE's.   Over 30 minutes of exam, counseling, chart review, and critical decision making was performed.   No future appointments.    ----------------------------------------------------------------------------------------------------------------------  HPI 36 y.o. male   presents for 3 month follow up on hypertension, cholesterol, diabetes, weight and vitamin D deficiency.   BMI is Body mass index is 31.9 kg/m., He has not been exercising or following diet due to low mood.  Wt Readings from Last 3 Encounters:  02/17/21 241 lb 12.8 oz (109.7 kg)  10/15/20 243 lb (110.2 kg)  02/05/20 246 lb (111.6 kg)   Right ear feels clogged, more trouble hearing out of that ear.  His blood pressure has been controlled at home, today their BP is BP: 112/80 BP Readings from Last 3 Encounters:  02/17/21 112/80  10/15/20 110/76  02/05/20 128/88    He was switched from Lexapro to Effexor at last visit and continued Wellbutrin.  He has not noticed an improvement in his mood.  Becomes irritable very quickly and does have periods of feeling down and is worse with Effexor. He has never seen psychiatry.    He does not workout. He denies chest pain, shortness of breath, dizziness.   He is on cholesterol medication . His cholesterol is not at goal. The cholesterol last visit was:   Lab Results  Component Value Date   CHOL 208 (H) 10/15/2020   HDL 36 (L) 10/15/2020   LDLCALC 136 (H) 10/15/2020   TRIG 219 (H) 10/15/2020   CHOLHDL 5.8 (H) 10/15/2020    He has been working on diet and exercise for abnormal glucose. Last A1C in the office was:  Lab Results  Component Value Date   HGBA1C 5.2 10/15/2020   Patient is on Vitamin D supplement.  Is taking Vit D3 2000 uinits 3 daily Lab Results  Component Value Date   VD25OH 34 10/15/2020        Current Medications:  Current Outpatient  Medications on File Prior to Visit  Medication Sig   bisoprolol-hydrochlorothiazide (ZIAC) 5-6.25 MG tablet Take  1 tablet  Daily  for BP   buPROPion (WELLBUTRIN XL) 300 MG 24 hr tablet Take  1 tablet  every Morning for Mood, Focus & Concentration   famotidine (PEPCID) 40 MG tablet Take 1 tablet at bedtime for Acid Reflux   omeprazole (PRILOSEC) 40 MG capsule Take  1 capsule  Daily  to  Prevent Heartburn & Indigestion   rosuvastatin (CRESTOR) 5 MG tablet Take 1 tablet (5 mg total) by mouth daily.   venlafaxine XR (EFFEXOR-XR) 75 MG 24 hr capsule TAKE ONE CAPSULE BY MOUTH DAILY   No current facility-administered medications on file prior to visit.     Allergies: No Known Allergies   Medical History:  Past Medical History:  Diagnosis Date   Anxiety    GERD (gastroesophageal reflux disease)    HTN (hypertension)    Mixed hyperlipidemia    Unspecified vitamin D deficiency    Family history- Reviewed and unchanged Social history- Reviewed and unchanged   Review of Systems:  Review of Systems  Constitutional:  Negative for chills, fever and weight loss.  HENT:  Negative for congestion and hearing loss.        Right ear feels clogged  Eyes:  Negative for blurred vision and double vision.  Respiratory:  Negative for cough and shortness of breath.   Cardiovascular:  Negative for chest pain, palpitations, orthopnea and leg swelling.  Gastrointestinal:  Negative for abdominal pain, constipation, diarrhea, heartburn, nausea and vomiting.  Musculoskeletal:  Negative for falls, joint pain and myalgias.  Skin:  Negative for rash.  Neurological:  Negative for dizziness, tingling, tremors, loss of consciousness and headaches.  Psychiatric/Behavioral:  Positive for depression. Negative for memory loss and suicidal ideas. The patient is nervous/anxious.      Physical Exam: BP 112/80    Pulse 81    Temp 97.9 F (36.6 C)    Wt 241 lb 12.8 oz (109.7 kg)    SpO2 98%    BMI 31.90 kg/m  Wt Readings from Last 3 Encounters:  02/17/21 241 lb 12.8 oz (109.7 kg)  10/15/20 243 lb (110.2 kg)  02/05/20 246 lb (111.6 kg)   General Appearance: Well nourished, in no apparent distress. Eyes: PERRLA, EOMs, conjunctiva no swelling or erythema Sinuses: No Frontal/maxillary tenderness ENT/Mouth: Ext aud canals clear, Left TM without erythema, bulging. Right TM impacted with cerumen-  irrigated and wax removed. TM no erythema or bulging. No erythema, swelling, or exudate on post pharynx.  Tonsils not swollen or erythematous. Hearing normal.  Neck: Supple, thyroid normal.  Respiratory: Respiratory effort normal, BS equal bilaterally without rales, rhonchi, wheezing or stridor.  Cardio: RRR with no MRGs. Brisk peripheral pulses without edema.  Abdomen: Soft, + BS.  Non tender, no guarding, rebound, hernias, masses. Lymphatics: Non tender without lymphadenopathy.  Musculoskeletal: Full ROM, 5/5 strength, Normal gait. Some crepitus of knees with extension. Skin: Warm, dry without rashes, lesions, ecchymosis.  Neuro: Cranial nerves intact. No cerebellar symptoms.  Psych: Awake and oriented X 3, normal affect, Insight and Judgment appropriate.    Magda Bernheim, NP 2:35 PM [facility]

## 2021-02-17 ENCOUNTER — Ambulatory Visit: Payer: 59 | Admitting: Nurse Practitioner

## 2021-02-17 ENCOUNTER — Encounter: Payer: Self-pay | Admitting: Nurse Practitioner

## 2021-02-17 ENCOUNTER — Other Ambulatory Visit: Payer: Self-pay

## 2021-02-17 VITALS — BP 112/80 | HR 81 | Temp 97.9°F | Wt 241.8 lb

## 2021-02-17 DIAGNOSIS — H6121 Impacted cerumen, right ear: Secondary | ICD-10-CM | POA: Diagnosis not present

## 2021-02-17 DIAGNOSIS — K219 Gastro-esophageal reflux disease without esophagitis: Secondary | ICD-10-CM | POA: Diagnosis not present

## 2021-02-17 DIAGNOSIS — I10 Essential (primary) hypertension: Secondary | ICD-10-CM

## 2021-02-17 DIAGNOSIS — E782 Mixed hyperlipidemia: Secondary | ICD-10-CM

## 2021-02-17 DIAGNOSIS — Z79899 Other long term (current) drug therapy: Secondary | ICD-10-CM | POA: Diagnosis not present

## 2021-02-17 DIAGNOSIS — F32A Depression, unspecified: Secondary | ICD-10-CM

## 2021-02-17 DIAGNOSIS — R7309 Other abnormal glucose: Secondary | ICD-10-CM

## 2021-02-17 DIAGNOSIS — E559 Vitamin D deficiency, unspecified: Secondary | ICD-10-CM

## 2021-02-17 DIAGNOSIS — F419 Anxiety disorder, unspecified: Secondary | ICD-10-CM

## 2021-02-17 MED ORDER — FLUOXETINE HCL 20 MG PO CAPS: 20.0000 mg | ORAL_CAPSULE | Freq: Every day | ORAL | 2 refills | Status: DC

## 2021-02-17 MED ORDER — BUSPIRONE HCL 5 MG PO TABS: 5.0000 mg | ORAL_TABLET | Freq: Three times a day (TID) | ORAL | 3 refills | Status: DC

## 2021-02-17 NOTE — Patient Instructions (Addendum)
Fluoxetine Capsules or Tablets (Depression/Mood Disorders) What is this medication? FLUOXETINE (floo OX e teen) treats depression, anxiety, obsessive-compulsive disorder (OCD), and eating disorders. It increases the amount of serotonin in the brain, a hormone that helps regulate mood. It belongs to a group of medications called SSRIs. This medicine may be used for other purposes; ask your health care provider or pharmacist if you have questions. COMMON BRAND NAME(S): Prozac What should I tell my care team before I take this medication? They need to know if you have any of these conditions: Bipolar disorder or a family history of bipolar disorder Bleeding disorders Glaucoma Heart disease Liver disease Low levels of sodium in the blood Seizures Suicidal thoughts, plans, or attempt; a previous suicide attempt by you or a family member Take MAOIs like Carbex, Eldepryl, Marplan, Nardil, and Parnate Take medications that treat or prevent blood clots Thyroid disease An unusual or allergic reaction to fluoxetine, other medications, foods, dyes, or preservatives Pregnant or trying to get pregnant Breast-feeding How should I use this medication? Take this medication by mouth with a glass of water. Follow the directions on the prescription label. You can take this medication with or without food. Take your medication at regular intervals. Do not take it more often than directed. Do not stop taking this medication suddenly except upon the advice of your care team. Stopping this medication too quickly may cause serious side effects or your condition may worsen. A special MedGuide will be given to you by the pharmacist with each prescription and refill. Be sure to read this information carefully each time. Talk to your care team about the use of this medication in children. While it may be prescribed for children as young as 7 years for selected conditions, precautions do apply. Overdosage: If you think  you have taken too much of this medicine contact a poison control center or emergency room at once. NOTE: This medicine is only for you. Do not share this medicine with others. What if I miss a dose? If you miss a dose, skip the missed dose and go back to your regular dosing schedule. Do not take double or extra doses. What may interact with this medication? Do not take this medication with any of the following: Other medications containing fluoxetine, like Sarafem or Symbyax Cisapride Dronedarone Linezolid MAOIs like Carbex, Eldepryl, Marplan, Nardil, and Parnate Methylene blue (injected into a vein) Pimozide Thioridazine This medication may also interact with the following: Alcohol Amphetamines Aspirin and aspirin-like medications Carbamazepine Certain medications for depression, anxiety, or psychotic disturbances Certain medications for migraine headaches like almotriptan, eletriptan, frovatriptan, naratriptan, rizatriptan, sumatriptan, zolmitriptan Digoxin Diuretics Fentanyl Flecainide Furazolidone Isoniazid Lithium Medications for sleep Medications that treat or prevent blood clots like warfarin, enoxaparin, and dalteparin NSAIDs, medications for pain and inflammation, like ibuprofen or naproxen Other medications that prolong the QT interval (an abnormal heart rhythm) Phenytoin Procarbazine Propafenone Rasagiline Ritonavir Supplements like St. John's wort, kava kava, valerian Tramadol Tryptophan Vinblastine This list may not describe all possible interactions. Give your health care provider a list of all the medicines, herbs, non-prescription drugs, or dietary supplements you use. Also tell them if you smoke, drink alcohol, or use illegal drugs. Some items may interact with your medicine. What should I watch for while using this medication? Tell your care team if your symptoms do not get better or if they get worse. Visit your care team for regular checks on your  progress. Because it may take several weeks to see the   full effects of this medication, it is important to continue your treatment as prescribed. Watch for new or worsening thoughts of suicide or depression. This includes sudden changes in mood, behavior, or thoughts. These changes can happen at any time but are more common in the beginning of treatment or after a change in dose. Call your care team right away if you experience these thoughts or worsening depression. Manic episodes may happen in patients with bipolar disorder who take this medication. Watch for changes in feelings or behaviors such as feeling anxious, nervous, agitated, panicky, irritable, hostile, aggressive, impulsive, severely restless, overly excited and hyperactive, or trouble sleeping. These symptoms can happen at anytime but are more common in the beginning of treatment or after a change in dose. Call your care team right away if you notice any of these symptoms. You may get drowsy or dizzy. Do not drive, use machinery, or do anything that needs mental alertness until you know how this medication affects you. Do not stand or sit up quickly, especially if you are an older patient. This reduces the risk of dizzy or fainting spells. Alcohol may interfere with the effect of this medication. Avoid alcoholic drinks. Your mouth may get dry. Chewing sugarless gum or sucking hard candy, and drinking plenty of water may help. Contact your care team if the problem does not go away or is severe. This medication may affect blood sugar levels. If you have diabetes, check with your care team before you make changes to your diet or medications. What side effects may I notice from receiving this medication? Side effects that you should report to your care team as soon as possible: Allergic reactions--skin rash, itching, hives, swelling of the face, lips, tongue, or throat Bleeding--bloody or black, tar-like stools, red or dark brown urine, vomiting  blood or brown material that looks like coffee grounds, small, red or purple spots on skin, unusual bleeding or bruising Heart rhythm changes--fast or irregular heartbeat, dizziness, feeling faint or lightheaded, chest pain, trouble breathing Loss of appetite with weight loss Low sodium level--muscle weakness, fatigue, dizziness, headache, confusion Serotonin syndrome--irritability, confusion, fast or irregular heartbeat, muscle stiffness, twitching muscles, sweating, high fever, seizure, chills, vomiting, diarrhea Sudden eye pain or change in vision such as blurry vision, seeing halos around lights, vision loss Thoughts of suicide or self-harm, worsening mood, feelings of depression Side effects that usually do not require medical attention (report to your care team if they continue or are bothersome): Anxiety, nervousness Change in sex drive or performance Diarrhea Dry mouth Headache Excessive sweating Nausea Tremors or shaking Trouble sleeping Upset stomach This list may not describe all possible side effects. Call your doctor for medical advice about side effects. You may report side effects to FDA at 1-800-FDA-1088. Where should I keep my medication? Keep out of the reach of children and pets. Store at room temperature between 15 and 30 degrees C (59 and 86 degrees F). Get rid of any unused medication after the expiration date. NOTE: This sheet is a summary. It may not cover all possible information. If you have questions about this medicine, talk to your doctor, pharmacist, or health care provider.  2022 [employer] Standard (2020-03-03 00:00:00)  

## 2021-02-18 LAB — CBC WITH DIFFERENTIAL/PLATELET
Absolute Monocytes: 792 cells/uL (ref 200–950)
Basophils Absolute: 27 cells/uL (ref 0–200)
Basophils Relative: 0.3 %
Eosinophils Absolute: 71 cells/uL (ref 15–500)
Eosinophils Relative: 0.8 %
HCT: 41.9 % (ref 38.5–50.0)
Hemoglobin: 14.3 g/dL (ref 13.2–17.1)
Lymphs Abs: 2234 cells/uL (ref 850–3900)
MCH: 31.6 pg (ref 27.0–33.0)
MCHC: 34.1 g/dL (ref 32.0–36.0)
MCV: 92.7 fL (ref 80.0–100.0)
MPV: 12.4 fL (ref 7.5–12.5)
Monocytes Relative: 8.9 %
Neutro Abs: 5776 cells/uL (ref 1500–7800)
Neutrophils Relative %: 64.9 %
Platelets: 247 10*3/uL (ref 140–400)
RBC: 4.52 10*6/uL (ref 4.20–5.80)
RDW: 12.5 % (ref 11.0–15.0)
Total Lymphocyte: 25.1 %
WBC: 8.9 10*3/uL (ref 3.8–10.8)

## 2021-02-18 LAB — MAGNESIUM: Magnesium: 2.2 mg/dL (ref 1.5–2.5)

## 2021-02-18 LAB — COMPLETE METABOLIC PANEL WITH GFR
AG Ratio: 1.7 (calc) (ref 1.0–2.5)
ALT: 42 U/L (ref 9–46)
AST: 27 U/L (ref 10–40)
Albumin: 4.5 g/dL (ref 3.6–5.1)
Alkaline phosphatase (APISO): 90 U/L (ref 36–130)
BUN: 10 mg/dL (ref 7–25)
CO2: 29 mmol/L (ref 20–32)
Calcium: 9.9 mg/dL (ref 8.6–10.3)
Chloride: 104 mmol/L (ref 98–110)
Creat: 1.15 mg/dL (ref 0.60–1.26)
Globulin: 2.6 g/dL (calc) (ref 1.9–3.7)
Glucose, Bld: 80 mg/dL (ref 65–99)
Potassium: 3.9 mmol/L (ref 3.5–5.3)
Sodium: 142 mmol/L (ref 135–146)
Total Bilirubin: 0.8 mg/dL (ref 0.2–1.2)
Total Protein: 7.1 g/dL (ref 6.1–8.1)
eGFR: 84 mL/min/{1.73_m2} (ref >=60)

## 2021-02-18 LAB — LIPID PANEL
Cholesterol: 155 mg/dL (ref <200)
HDL: 38 mg/dL — ABNORMAL LOW (ref >=40)
LDL Cholesterol (Calc): 79 mg/dL (calc)
Non-HDL Cholesterol (Calc): 117 mg/dL (calc) (ref <130)
Total CHOL/HDL Ratio: 4.1 (calc) (ref <5.0)
Triglycerides: 315 mg/dL — ABNORMAL HIGH (ref <150)

## 2021-03-08 ENCOUNTER — Other Ambulatory Visit: Payer: Self-pay | Admitting: Internal Medicine

## 2021-03-21 NOTE — Progress Notes (Signed)
?FOLLOW UP ? ?Assessment and Plan:  ? ? ?Depression/Anxiety ?Currently Prozac 20 mg and wellbutrin 300 mg daily ?Continue Buspar 5 mg TID PRN for anxiety and irritability ?Denies thoughts of hurting self or others ?Instructed patient to contact office or on-call physician promptly should condition worsen or any new symptoms appear. IF THE PATIENT HAS ANY SUICIDAL OR HOMICIDAL IDEATIONS, CALL THE OFFICE, DISCUSS WITH A SUPPORT MEMBER, OR GO TO THE ER IMMEDIATELY. Patient was agreeable with this plan.  ?If he does determine current treatment plan does not control symptoms he is to notify the office and will plan to stop Wellbutrin and add Trintellix ? ?Medication Management ?Continued ? ?Obesity ?Long discussion about weight loss, diet, and exercise ?Recommended diet heavy in fruits and veggies and low in animal meats, cheeses, and dairy products, appropriate calorie intake ?Follow up at next visit ? ? ? ?Continue diet and meds as discussed. Further disposition pending results of labs. Discussed med's effects and SE's.   ?Over 30 minutes of exam, counseling, chart review, and critical decision making was performed.  ? ?No future appointments. ? ? ? ? ?---------------------------------------------------------------------------------------------------------------------- ? ?HPI ?36 y.o. male  presents for 3 month follow up on hypertension, cholesterol, diabetes, weight and vitamin D deficiency.  ? ?BMI is Body mass index is 32.32 kg/m?., He has not been exercising or following diet due to low mood.  ?Wt Readings from Last 3 Encounters:  ?03/23/21 245 lb (111.1 kg)  ?02/17/21 241 lb 12.8 oz (109.7 kg)  ?10/15/20 243 lb (110.2 kg)  ? ? ?He is having more good days than  bad days.  He is having more enjoyment in things.  He has noticed an improvement in irritability with use of Buspar. No suicidal and homicidal ideation.  ? ? ? ?Current Medications:  ?Current Outpatient Medications on File Prior to Visit  ?Medication Sig   ? bisoprolol-hydrochlorothiazide (ZIAC) 5-6.25 MG tablet Take  1 tablet  Daily  for BP  ? buPROPion (WELLBUTRIN XL) 300 MG 24 hr tablet Take  1 tablet  every Morning for Mood, Focus & Concentration  ? busPIRone (BUSPAR) 5 MG tablet Take 1 tablet (5 mg total) by mouth 3 (three) times daily.  ? famotidine (PEPCID) 40 MG tablet Take  1 table t at Bedtime to Prevent Heartburn & Acid Indigestion                                 /                              TAKE                      BY MOUTH  ? FLUoxetine (PROZAC) 20 MG capsule Take 1 capsule (20 mg total) by mouth daily.  ? omeprazole (PRILOSEC) 40 MG capsule Take  1 capsule  Daily  to Prevent Heartburn & Indigestion  ? rosuvastatin (CRESTOR) 5 MG tablet Take 1 tablet (5 mg total) by mouth daily.  ? ?No current facility-administered medications on file prior to visit.  ? ? ? ?Allergies: No Known Allergies  ? ?Medical History:  ?Past Medical History:  ?Diagnosis Date  ? Anxiety   ? GERD (gastroesophageal reflux disease)   ? HTN (hypertension)   ? Mixed hyperlipidemia   ? Unspecified vitamin D deficiency   ? ?Family history- Reviewed and unchanged ?Social  history- Reviewed and unchanged ? ? ?Review of Systems:  ?Review of Systems  ?Constitutional:  Negative for chills, fever and weight loss.  ?HENT:  Negative for congestion and hearing loss.   ?Eyes:  Negative for blurred vision and double vision.  ?Respiratory:  Negative for cough and shortness of breath.   ?Cardiovascular:  Negative for chest pain, palpitations, orthopnea and leg swelling.  ?Gastrointestinal:  Negative for abdominal pain, constipation, diarrhea, heartburn, nausea and vomiting.  ?Musculoskeletal:  Negative for falls, joint pain and myalgias.  ?Skin:  Negative for rash.  ?Neurological:  Negative for dizziness, tingling, tremors, loss of consciousness and headaches.  ?Psychiatric/Behavioral:  Positive for depression. Negative for memory loss and suicidal ideas. The patient is nervous/anxious.    ? ? ? ?Physical Exam: ?BP 114/76   Pulse 77   Temp 97.7 ?F (36.5 ?C)   Wt 245 lb (111.1 kg)   SpO2 98%   BMI 32.32 kg/m?  ?Wt Readings from Last 3 Encounters:  ?03/23/21 245 lb (111.1 kg)  ?02/17/21 241 lb 12.8 oz (109.7 kg)  ?10/15/20 243 lb (110.2 kg)  ? ?General Appearance: Well nourished, in no apparent distress. ?Eyes: PERRLA, EOMs, conjunctiva no swelling or erythema ?Sinuses: No Frontal/maxillary tenderness ?ENT/Mouth: Ext aud canals clear, Left TM without erythema, bulging. Right TM impacted with cerumen- irrigated and wax removed. TM no erythema or bulging. No erythema, swelling, or exudate on post pharynx.  Tonsils not swollen or erythematous. Hearing normal.  ?Neck: Supple, thyroid normal.  ?Respiratory: Respiratory effort normal, BS equal bilaterally without rales, rhonchi, wheezing or stridor.  ?Cardio: RRR with no MRGs. Brisk peripheral pulses without edema.  ?Abdomen: Soft, + BS.  Non tender, no guarding, rebound, hernias, masses. ?Lymphatics: Non tender without lymphadenopathy.  ?Musculoskeletal: Full ROM, 5/5 strength, Normal gait. Some crepitus of knees with extension. ?Skin: Warm, dry without rashes, lesions, ecchymosis.  ?Neuro: Cranial nerves intact. No cerebellar symptoms.  ?Psych: Awake and oriented X 3, normal affect, Insight and Judgment appropriate.  ? ? ?Revonda Humphrey, NP ?2:50 PM ?[facility]  ?

## 2021-03-23 ENCOUNTER — Ambulatory Visit: Payer: 59 | Admitting: Nurse Practitioner

## 2021-03-23 ENCOUNTER — Other Ambulatory Visit: Payer: Self-pay

## 2021-03-23 ENCOUNTER — Encounter: Payer: Self-pay | Admitting: Nurse Practitioner

## 2021-03-23 VITALS — BP 114/76 | HR 77 | Temp 97.7°F | Wt 245.0 lb

## 2021-03-23 DIAGNOSIS — R7309 Other abnormal glucose: Secondary | ICD-10-CM

## 2021-03-23 DIAGNOSIS — F419 Anxiety disorder, unspecified: Secondary | ICD-10-CM

## 2021-03-23 DIAGNOSIS — F321 Major depressive disorder, single episode, moderate: Secondary | ICD-10-CM

## 2021-03-23 DIAGNOSIS — Z79899 Other long term (current) drug therapy: Secondary | ICD-10-CM

## 2021-03-23 DIAGNOSIS — E782 Mixed hyperlipidemia: Secondary | ICD-10-CM

## 2021-03-23 DIAGNOSIS — I10 Essential (primary) hypertension: Secondary | ICD-10-CM

## 2021-05-15 ENCOUNTER — Other Ambulatory Visit: Payer: Self-pay | Admitting: Nurse Practitioner

## 2021-05-15 DIAGNOSIS — F32A Depression, unspecified: Secondary | ICD-10-CM

## 2021-05-19 ENCOUNTER — Encounter: Payer: Self-pay | Admitting: Internal Medicine

## 2021-05-20 ENCOUNTER — Encounter: Payer: Self-pay | Admitting: Nurse Practitioner

## 2021-05-20 ENCOUNTER — Other Ambulatory Visit: Payer: Self-pay | Admitting: Nurse Practitioner

## 2021-05-20 DIAGNOSIS — B029 Zoster without complications: Secondary | ICD-10-CM

## 2021-05-20 MED ORDER — VALACYCLOVIR HCL 500 MG PO TABS: 500.0000 mg | ORAL_TABLET | Freq: Two times a day (BID) | ORAL | 0 refills | Status: AC

## 2021-06-14 ENCOUNTER — Other Ambulatory Visit: Payer: Self-pay | Admitting: Nurse Practitioner

## 2021-06-14 DIAGNOSIS — F419 Anxiety disorder, unspecified: Secondary | ICD-10-CM

## 2021-06-28 ENCOUNTER — Other Ambulatory Visit: Payer: Self-pay

## 2021-06-28 DIAGNOSIS — I10 Essential (primary) hypertension: Secondary | ICD-10-CM

## 2021-06-28 MED ORDER — BISOPROLOL-HYDROCHLOROTHIAZIDE 5-6.25 MG PO TABS: ORAL_TABLET | ORAL | 3 refills | Status: DC

## 2021-07-04 ENCOUNTER — Other Ambulatory Visit: Payer: Self-pay

## 2021-07-04 DIAGNOSIS — K219 Gastro-esophageal reflux disease without esophagitis: Secondary | ICD-10-CM

## 2021-07-04 MED ORDER — OMEPRAZOLE 40 MG PO CPDR: DELAYED_RELEASE_CAPSULE | ORAL | 3 refills | Status: DC

## 2021-07-25 ENCOUNTER — Encounter: Payer: 59 | Admitting: Nurse Practitioner

## 2021-07-25 NOTE — Progress Notes (Unsigned)
Complete Physical  Assessment and Plan:  Peter Le was seen today for annual exam.  Diagnoses and all orders for this visit:  Encounter for general adult medical examination with abnormal findings Due yearly  Essential hypertension - continue medications, DASH diet, exercise and monitor at home. Call if greater than 130/80.   Go to the ER if any chest pain, shortness of breath, nausea, dizziness, severe HA, changes vision/speech  -     CBC with Differential/Platelet -     COMPLETE METABOLIC PANEL WITH GFR  Gastroesophageal reflux disease, unspecified whether esophagitis present Continue Prilosec in am and Famotidine in PM.  Add carafate before bed If symptoms persist will refer to GI for possible endoscopy -     Magnesium -     sucralfate (CARAFATE) 1 g tablet; Take 1 tablet (1 g total) by mouth at bedtime.  Hyperlipidemia, mixed Continue medication, diet and exercsie -     CBC with Differential/Platelet -     COMPLETE METABOLIC PANEL WITH GFR -     Lipid panel  Abnormal glucose Continue diet and exercise, focus on weight loss -     Hemoglobin A1c  Current moderate episode of major depressive disorder, unspecified whether recurrent (HCC)/Anxiety Currently well controlled with Wellbutrin 355m , Prozac 20 mg and Buspar 5 mg TID PRN Continue diet and exercise  Vitamin D deficiency Depending on result will recommend Vit D 3 supplementation -     VITAMIN D 25 Hydroxy (Vit-D Deficiency, Fractures)  Medication management -     CBC with Differential/Platelet -     COMPLETE METABOLIC PANEL WITH GFR -     Magnesium -     Lipid panel -     TSH -     Hemoglobin A1c -     VITAMIN D 25 Hydroxy (Vit-D Deficiency, Fractures) -     EKG 12-Lead -     Urinalysis, Routine w reflex microscopic -     Microalbumin / creatinine urine ratio  Screening for ischemic heart disease -     EKG 12-Lead  Screening for thyroid disorder -     TSH  Screening for hematuria or proteinuria -      Urinalysis, Routine w reflex microscopic -     Microalbumin / creatinine urine ratio     Discussed med's effects and SE's. Screening labs and tests as requested with regular follow-up as recommended. Over 40 minutes of exam, counseling, chart review and critical decision making was performed  HPI Patient presents for a complete physical.   His blood pressure has been controlled at home, today their BP is BP: 110/78  BP Readings from Last 3 Encounters:  07/26/21 110/78  03/23/21 114/76  02/17/21 112/80  He does workout. He denies chest pain, shortness of breath, dizziness.   BMI is Body mass index is 32.39 kg/m., he has been working on diet and exercise. He is drinking less sweet drinks  and drinking more water.  Wt Readings from Last 3 Encounters:  07/26/21 238 lb 12.8 oz (108.3 kg)  03/23/21 245 lb (111.1 kg)  02/17/21 241 lb 12.8 oz (109.7 kg)   He continues to have issues with heartburn and is using Omeprazole 40 mg QAM and Famotidine 40 mg in the evening.  He has not been able to pinpoint a dietary trigger.   He is on cholesterol medication , Rosuvastatin 5 mg QD and denies myalgias. His cholesterol is at goal. The cholesterol last visit was:   Lab Results  Component Value Date   CHOL 155 02/17/2021   HDL 38 (L) 02/17/2021   LDLCALC 79 02/17/2021   TRIG 315 (H) 02/17/2021   CHOLHDL 4.1 02/17/2021   He has been working on diet and exercise for abnormal glucose . Last A1C in the office was:  Lab Results  Component Value Date   HGBA1C 5.2 10/15/2020   Last GFR: Lab Results  Component Value Date   EGFR 84 02/17/2021      Patient is on Vitamin D supplement.   Lab Results  Component Value Date   VD25OH 34 10/15/2020       Current Medications:  Current Outpatient Medications on File Prior to Visit  Medication Sig Dispense Refill   bisoprolol-hydrochlorothiazide (ZIAC) 5-6.25 MG tablet Take  1 tablet  Daily  for BP 90 tablet 3   buPROPion (WELLBUTRIN XL) 300 MG  24 hr tablet Take  1 tablet  every Morning for Mood, Focus & Concentration 90 tablet 3   busPIRone (BUSPAR) 5 MG tablet TAKE ONE TABLET BY MOUTH THREE TIMES A DAY 90 tablet 3   famotidine (PEPCID) 40 MG tablet Take  1 table t at Bedtime to Prevent Heartburn & Acid Indigestion                                 /                              TAKE                      BY MOUTH 90 tablet 3   FLUoxetine (PROZAC) 20 MG capsule TAKE ONE CAPSULE BY MOUTH DAILY 30 capsule 2   omeprazole (PRILOSEC) 40 MG capsule Take  1 capsule  Daily  to Prevent Heartburn & Indigestion 90 capsule 3   rosuvastatin (CRESTOR) 5 MG tablet Take 1 tablet (5 mg total) by mouth daily. 30 tablet 11   No current facility-administered medications on file prior to visit.   Allergies:  No Known Allergies Health Maintenance:  Immunization History  Administered Date(s) Administered   Influenza Inj Mdck Quad Pf 11/15/2016   Influenza-Unspecified 10/02/2017   PPD Test 03/21/2018, 07/23/2019   Pneumococcal Polysaccharide-23 06/23/2010   Tdap 06/23/2010    Tetanus: Pneumovax: Prevnar 13: Flu vaccine: Zostavax:  DEXA: Colonoscopy: EGD: Eye Exam: Dentist:  Patient Care Team: Unk Pinto, MD as PCP - General (Internal Medicine) Gatha Mayer, MD as Consulting Physician (Gastroenterology)  Medical History:  has Obesity; Labile hypertension; Hyperlipidemia, mixed; Testosterone deficiency; Vitamin B12 deficiency; and Vitamin D deficiency on their problem list. Surgical History:  He  has a past surgical history that includes Hernia repair (age 30) and Cholecystectomy (2008). Family History:  His family history includes Hypertension in his father. Social History:   reports that he quit smoking about 18 years ago. His smoking use included cigarettes. He has never used smokeless tobacco. He reports current alcohol use. He reports that he does not use drugs. Review of Systems:  Review of Systems  Constitutional:   Negative for chills and fever.  HENT:  Negative for congestion, hearing loss, sinus pain, sore throat and tinnitus.   Eyes:  Negative for blurred vision and double vision.  Respiratory:  Negative for cough, hemoptysis, sputum production, shortness of breath and wheezing.   Cardiovascular:  Negative for chest pain, palpitations and  leg swelling.  Gastrointestinal:  Positive for heartburn. Negative for abdominal pain, constipation, diarrhea, nausea and vomiting.  Genitourinary:  Negative for dysuria and urgency.  Musculoskeletal:  Negative for back pain, falls, joint pain, myalgias and neck pain.  Skin:  Negative for rash.  Neurological:  Negative for dizziness, tingling, tremors, weakness and headaches.  Endo/Heme/Allergies:  Does not bruise/bleed easily.  Psychiatric/Behavioral:  Negative for depression and suicidal ideas. The patient is not nervous/anxious and does not have insomnia.     Physical Exam: Estimated body mass index is 32.39 kg/m as calculated from the following:   Height as of this encounter: 6' (1.829 m).   Weight as of this encounter: 238 lb 12.8 oz (108.3 kg). BP 110/78   Pulse 71   Temp 97.9 F (36.6 C)   Ht 6' (1.829 m)   Wt 238 lb 12.8 oz (108.3 kg)   SpO2 99%   BMI 32.39 kg/m  General Appearance: Well nourished, in no apparent distress.  Eyes: PERRLA, EOMs, conjunctiva no swelling or erythema, normal fundi and vessels.  Sinuses: No Frontal/maxillary tenderness  ENT/Mouth: Ext aud canals clear, normal light reflex with TMs without erythema, bulging. Good dentition. No erythema, swelling, or exudate on post pharynx. Tonsils not swollen or erythematous. Hearing normal.  Neck: Supple, thyroid normal. No bruits  Respiratory: Respiratory effort normal, BS equal bilaterally without rales, rhonchi, wheezing or stridor.  Cardio: RRR without murmurs, rubs or gallops. Brisk peripheral pulses without edema.  Chest: symmetric, with normal excursions and percussion.   Abdomen: Soft, nontender, no guarding, rebound, hernias, masses, or organomegaly.  Lymphatics: Non tender without lymphadenopathy.  Genitourinary:  Musculoskeletal: Full ROM all peripheral extremities,5/5 strength, and normal gait.  Skin: Warm, dry without rashes, lesions, ecchymosis. Neuro: Cranial nerves intact, reflexes equal bilaterally. Normal muscle tone, no cerebellar symptoms. Sensation intact.  Psych: Awake and oriented X 3, normal affect, Insight and Judgment appropriate.   EKG: NSR, no ST changes   Antonino Nienhuis E Carlye Grippe 2:28 PM [facility]

## 2021-07-26 ENCOUNTER — Ambulatory Visit (INDEPENDENT_AMBULATORY_CARE_PROVIDER_SITE_OTHER): Payer: 59 | Admitting: Nurse Practitioner

## 2021-07-26 ENCOUNTER — Encounter: Payer: Self-pay | Admitting: Nurse Practitioner

## 2021-07-26 VITALS — BP 110/78 | HR 71 | Temp 97.9°F | Ht 72.0 in | Wt 238.8 lb

## 2021-07-26 DIAGNOSIS — Z1329 Encounter for screening for other suspected endocrine disorder: Secondary | ICD-10-CM | POA: Diagnosis not present

## 2021-07-26 DIAGNOSIS — Z136 Encounter for screening for cardiovascular disorders: Secondary | ICD-10-CM

## 2021-07-26 DIAGNOSIS — Z79899 Other long term (current) drug therapy: Secondary | ICD-10-CM | POA: Diagnosis not present

## 2021-07-26 DIAGNOSIS — Z1389 Encounter for screening for other disorder: Secondary | ICD-10-CM | POA: Diagnosis not present

## 2021-07-26 DIAGNOSIS — Z0000 Encounter for general adult medical examination without abnormal findings: Secondary | ICD-10-CM | POA: Diagnosis not present

## 2021-07-26 DIAGNOSIS — Z0001 Encounter for general adult medical examination with abnormal findings: Secondary | ICD-10-CM

## 2021-07-26 DIAGNOSIS — I10 Essential (primary) hypertension: Secondary | ICD-10-CM | POA: Diagnosis not present

## 2021-07-26 DIAGNOSIS — F419 Anxiety disorder, unspecified: Secondary | ICD-10-CM

## 2021-07-26 DIAGNOSIS — E559 Vitamin D deficiency, unspecified: Secondary | ICD-10-CM

## 2021-07-26 DIAGNOSIS — R7309 Other abnormal glucose: Secondary | ICD-10-CM | POA: Diagnosis not present

## 2021-07-26 DIAGNOSIS — E782 Mixed hyperlipidemia: Secondary | ICD-10-CM | POA: Diagnosis not present

## 2021-07-26 DIAGNOSIS — K219 Gastro-esophageal reflux disease without esophagitis: Secondary | ICD-10-CM | POA: Diagnosis not present

## 2021-07-26 DIAGNOSIS — F321 Major depressive disorder, single episode, moderate: Secondary | ICD-10-CM

## 2021-07-26 MED ORDER — SUCRALFATE 1 G PO TABS: 1.0000 g | ORAL_TABLET | Freq: Every day | ORAL | 1 refills | Status: DC

## 2021-07-27 LAB — CBC WITH DIFFERENTIAL/PLATELET
Absolute Monocytes: 657 cells/uL (ref 200–950)
Basophils Absolute: 39 cells/uL (ref 0–200)
Basophils Relative: 0.6 %
Eosinophils Absolute: 59 cells/uL (ref 15–500)
Eosinophils Relative: 0.9 %
HCT: 44.2 % (ref 38.5–50.0)
Hemoglobin: 15.2 g/dL (ref 13.2–17.1)
Lymphs Abs: 2054 cells/uL (ref 850–3900)
MCH: 32.2 pg (ref 27.0–33.0)
MCHC: 34.4 g/dL (ref 32.0–36.0)
MCV: 93.6 fL (ref 80.0–100.0)
MPV: 12.1 fL (ref 7.5–12.5)
Monocytes Relative: 10.1 %
Neutro Abs: 3692 cells/uL (ref 1500–7800)
Neutrophils Relative %: 56.8 %
Platelets: 270 10*3/uL (ref 140–400)
RBC: 4.72 10*6/uL (ref 4.20–5.80)
RDW: 12.7 % (ref 11.0–15.0)
Total Lymphocyte: 31.6 %
WBC: 6.5 10*3/uL (ref 3.8–10.8)

## 2021-07-27 LAB — URINALYSIS, ROUTINE W REFLEX MICROSCOPIC
Bilirubin Urine: NEGATIVE
Glucose, UA: NEGATIVE
Hgb urine dipstick: NEGATIVE
Ketones, ur: NEGATIVE
Leukocytes,Ua: NEGATIVE
Nitrite: NEGATIVE
Protein, ur: NEGATIVE
Specific Gravity, Urine: 1.011 (ref 1.001–1.035)
pH: 5.5 (ref 5.0–8.0)

## 2021-07-27 LAB — LIPID PANEL
Cholesterol: 183 mg/dL (ref <200)
HDL: 37 mg/dL — ABNORMAL LOW (ref >=40)
LDL Cholesterol (Calc): 94 mg/dL (calc)
Non-HDL Cholesterol (Calc): 146 mg/dL (calc) — ABNORMAL HIGH (ref <130)
Total CHOL/HDL Ratio: 4.9 (calc) (ref <5.0)
Triglycerides: 393 mg/dL — ABNORMAL HIGH (ref <150)

## 2021-07-27 LAB — COMPLETE METABOLIC PANEL WITH GFR
AG Ratio: 1.8 (calc) (ref 1.0–2.5)
ALT: 48 U/L — ABNORMAL HIGH (ref 9–46)
AST: 30 U/L (ref 10–40)
Albumin: 4.7 g/dL (ref 3.6–5.1)
Alkaline phosphatase (APISO): 102 U/L (ref 36–130)
BUN: 10 mg/dL (ref 7–25)
CO2: 30 mmol/L (ref 20–32)
Calcium: 10.1 mg/dL (ref 8.6–10.3)
Chloride: 103 mmol/L (ref 98–110)
Creat: 1.19 mg/dL (ref 0.60–1.26)
Globulin: 2.6 g/dL (calc) (ref 1.9–3.7)
Glucose, Bld: 78 mg/dL (ref 65–99)
Potassium: 4.5 mmol/L (ref 3.5–5.3)
Sodium: 142 mmol/L (ref 135–146)
Total Bilirubin: 0.8 mg/dL (ref 0.2–1.2)
Total Protein: 7.3 g/dL (ref 6.1–8.1)
eGFR: 80 mL/min/{1.73_m2} (ref >=60)

## 2021-07-27 LAB — MICROALBUMIN / CREATININE URINE RATIO
Creatinine, Urine: 88 mg/dL (ref 20–320)
Microalb, Ur: <0.2 mg/dL

## 2021-07-27 LAB — HEMOGLOBIN A1C
Hgb A1c MFr Bld: 5.1 % of total Hgb (ref <5.7)
Mean Plasma Glucose: 100 mg/dL
eAG (mmol/L): 5.5 mmol/L

## 2021-07-27 LAB — VITAMIN D 25 HYDROXY (VIT D DEFICIENCY, FRACTURES): Vit D, 25-Hydroxy: 21 ng/mL — ABNORMAL LOW (ref 30–100)

## 2021-07-27 LAB — MAGNESIUM: Magnesium: 2.4 mg/dL (ref 1.5–2.5)

## 2021-07-27 LAB — TSH: TSH: 0.98 mIU/L (ref 0.40–4.50)

## 2021-08-11 ENCOUNTER — Other Ambulatory Visit: Payer: Self-pay | Admitting: Nurse Practitioner

## 2021-08-11 DIAGNOSIS — F32A Depression, unspecified: Secondary | ICD-10-CM

## 2021-09-13 ENCOUNTER — Other Ambulatory Visit: Payer: Self-pay | Admitting: Nurse Practitioner

## 2021-09-13 DIAGNOSIS — F419 Anxiety disorder, unspecified: Secondary | ICD-10-CM

## 2021-09-27 ENCOUNTER — Other Ambulatory Visit: Payer: Self-pay | Admitting: Nurse Practitioner

## 2021-09-27 DIAGNOSIS — K219 Gastro-esophageal reflux disease without esophagitis: Secondary | ICD-10-CM

## 2021-10-21 ENCOUNTER — Other Ambulatory Visit: Payer: Self-pay | Admitting: Nurse Practitioner

## 2021-10-21 DIAGNOSIS — E782 Mixed hyperlipidemia: Secondary | ICD-10-CM

## 2021-12-06 ENCOUNTER — Other Ambulatory Visit: Payer: Self-pay | Admitting: Nurse Practitioner

## 2021-12-06 DIAGNOSIS — K219 Gastro-esophageal reflux disease without esophagitis: Secondary | ICD-10-CM

## 2021-12-14 ENCOUNTER — Other Ambulatory Visit: Payer: Self-pay

## 2021-12-14 DIAGNOSIS — F32A Depression, unspecified: Secondary | ICD-10-CM

## 2021-12-14 MED ORDER — BUPROPION HCL ER (XL) 300 MG PO TB24: ORAL_TABLET | ORAL | 3 refills | Status: DC

## 2022-01-26 ENCOUNTER — Ambulatory Visit: Payer: 59 | Admitting: Nurse Practitioner

## 2022-01-26 NOTE — Progress Notes (Deleted)
FOLLOW UP  Assessment and Plan:   Hypertension Well controlled with current medications  Monitor blood pressure at home; patient to call if consistently greater than 130/80 Continue DASH diet.   Reminder to go to the ER if any CP, SOB, nausea, dizziness, severe HA, changes vision/speech, left arm numbness and tingling and jaw pain. CBC  Hyperlipidemia Currently not at goal; If cholesterol remains elevated with this lab will start Crestor 5 mg daily Continue low cholesterol diet and exercise.  Check lipid panel.  CMP   Abnormal glucose Continue diet and exercise.  Perform daily foot/skin check, notify office of any concerning changes.  Last A1c's have been at goal Defer A1c- check CMP  GERD Continue Famotidine and behavior modifications  Depression/Anxiety Currently not well controlled on Effexor and Wellbutrin D/c effexor and begin Prozac 20 mg Begin Buspar 5 mg TID PRN for anxiety and irritability Follow up in 1 month for reevaluation, if mood not improved will refer to psychiatry Denies thoughts of hurting self or others Instructed patient to contact office or on-call physician promptly should condition worsen or any new symptoms appear. IF THE PATIENT HAS ANY SUICIDAL OR HOMICIDAL IDEATIONS, CALL THE OFFICE, DISCUSS WITH A SUPPORT MEMBER, OR GO TO THE ER IMMEDIATELY. Patient was agreeable with this plan.   Medication Management Continued  Vitamin D Def Last visit was low,encourage Vit D supplementation -Vit D level  Elevated LFT Limit alcohol and tylenol - CMP   Continue diet and meds as discussed. Further disposition pending results of labs. Discussed med's effects and SE's.   Over 30 minutes of exam, counseling, chart review, and critical decision making was performed.   Future Appointments  Date Time Provider [place]  01/26/2022  2:30 PM Alycia Rossetti, NP [facility] None  07/27/2022  2:00 PM Alycia Rossetti, NP [facility] None       ----------------------------------------------------------------------------------------------------------------------  HPI 37 y.o. male  presents for 3 month follow up on hypertension, cholesterol, diabetes, weight and vitamin D deficiency.   BMI is There is no height or weight on file to calculate BMI., He has not been exercising or following diet due to low mood.  Wt Readings from Last 3 Encounters:  07/26/21 238 lb 12.8 oz (108.3 kg)  03/23/21 245 lb (111.1 kg)  02/17/21 241 lb 12.8 oz (109.7 kg)   Right ear feels clogged, more trouble hearing out of that ear.  His blood pressure has been controlled at home, today their BP is   BP Readings from Last 3 Encounters:  07/26/21 110/78  03/23/21 114/76  02/17/21 112/80    He was switched from Lexapro to Effexor at last visit and continued Wellbutrin.  He has not noticed an improvement in his mood.  Becomes irritable very quickly and does have periods of feeling down and is worse with Effexor. He has never seen psychiatry.    He does not workout. He denies chest pain, shortness of breath, dizziness.   He is on cholesterol medication . His cholesterol is not at goal. The cholesterol last visit was:   Lab Results  Component Value Date   CHOL 183 07/26/2021   HDL 37 (L) 07/26/2021   LDLCALC 94 07/26/2021   TRIG 393 (H) 07/26/2021   CHOLHDL 4.9 07/26/2021    He has been working on diet and exercise for abnormal glucose. Last A1C in the office was:  Lab Results  Component Value Date   HGBA1C 5.1 07/26/2021   Patient is on Vitamin D supplement.  Is taking Vit D3 2000 uinits 3 daily Lab Results  Component Value Date   VD25OH 21 (L) 07/26/2021        Current Medications:  Current Outpatient Medications on File Prior to Visit  Medication Sig   bisoprolol-hydrochlorothiazide (ZIAC) 5-6.25 MG tablet Take  1 tablet  Daily  for BP   buPROPion (WELLBUTRIN XL) 300 MG 24 hr tablet Take  1 tablet  every Morning for Mood, Focus  & Concentration   busPIRone (BUSPAR) 5 MG tablet TAKE ONE TABLET BY MOUTH THREE TIMES A DAY   famotidine (PEPCID) 40 MG tablet Take  1 table t at Bedtime to Prevent Heartburn & Acid Indigestion                                 /                              TAKE                      BY MOUTH   FLUoxetine (PROZAC) 20 MG capsule Take  1 capsule  Daily  for Mood                                                /                                   TAKE                                     BY                            MOUTH   omeprazole (PRILOSEC) 40 MG capsule Take  1 capsule  Daily  to Prevent Heartburn & Indigestion   rosuvastatin (CRESTOR) 5 MG tablet TAKE ONE TABLET BY MOUTH DAILY   sucralfate (CARAFATE) 1 g tablet TAKE 1 TABLET BY MOUTH AT BEDTIME   No current facility-administered medications on file prior to visit.     Allergies: No Known Allergies   Medical History:  Past Medical History:  Diagnosis Date   Anxiety    GERD (gastroesophageal reflux disease)    HTN (hypertension)    Mixed hyperlipidemia    Unspecified vitamin D deficiency    Family history- Reviewed and unchanged Social history- Reviewed and unchanged   Review of Systems:  Review of Systems  Constitutional:  Negative for chills, fever and weight loss.  HENT:  Negative for congestion and hearing loss.        Right ear feels clogged  Eyes:  Negative for blurred vision and double vision.  Respiratory:  Negative for cough and shortness of breath.   Cardiovascular:  Negative for chest pain, palpitations, orthopnea and leg swelling.  Gastrointestinal:  Negative for abdominal pain, constipation, diarrhea, heartburn, nausea and vomiting.  Musculoskeletal:  Negative for falls, joint pain and myalgias.  Skin:  Negative for rash.  Neurological:  Negative for dizziness, tingling, tremors, loss of consciousness and headaches.  Psychiatric/Behavioral:  Positive  for depression. Negative for memory loss and suicidal ideas. The  patient is nervous/anxious.       Physical Exam: There were no vitals taken for this visit. Wt Readings from Last 3 Encounters:  07/26/21 238 lb 12.8 oz (108.3 kg)  03/23/21 245 lb (111.1 kg)  02/17/21 241 lb 12.8 oz (109.7 kg)   General Appearance: Well nourished, in no apparent distress. Eyes: PERRLA, EOMs, conjunctiva no swelling or erythema Sinuses: No Frontal/maxillary tenderness ENT/Mouth: Ext aud canals clear, Left TM without erythema, bulging. Right TM impacted with cerumen- irrigated and wax removed. TM no erythema or bulging. No erythema, swelling, or exudate on post pharynx.  Tonsils not swollen or erythematous. Hearing normal.  Neck: Supple, thyroid normal.  Respiratory: Respiratory effort normal, BS equal bilaterally without rales, rhonchi, wheezing or stridor.  Cardio: RRR with no MRGs. Brisk peripheral pulses without edema.  Abdomen: Soft, + BS.  Non tender, no guarding, rebound, hernias, masses. Lymphatics: Non tender without lymphadenopathy.  Musculoskeletal: Full ROM, 5/5 strength, Normal gait. Some crepitus of knees with extension. Skin: Warm, dry without rashes, lesions, ecchymosis.  Neuro: Cranial nerves intact. No cerebellar symptoms.  Psych: Awake and oriented X 3, normal affect, Insight and Judgment appropriate.    Alycia Rossetti, NP 8:33 AM [facility]

## 2022-02-08 DIAGNOSIS — H11431 Conjunctival hyperemia, right eye: Secondary | ICD-10-CM | POA: Diagnosis not present

## 2022-02-14 ENCOUNTER — Encounter: Payer: Self-pay | Admitting: Nurse Practitioner

## 2022-02-14 ENCOUNTER — Ambulatory Visit: Payer: 59 | Admitting: Nurse Practitioner

## 2022-02-14 VITALS — Temp 100.8°F

## 2022-02-14 DIAGNOSIS — U071 COVID-19: Secondary | ICD-10-CM

## 2022-02-14 DIAGNOSIS — K219 Gastro-esophageal reflux disease without esophagitis: Secondary | ICD-10-CM

## 2022-02-14 MED ORDER — DEXAMETHASONE 1 MG PO TABS: ORAL_TABLET | ORAL | 0 refills | Status: DC

## 2022-02-14 MED ORDER — SUCRALFATE 1 G PO TABS: 1.0000 g | ORAL_TABLET | Freq: Every day | ORAL | 1 refills | Status: DC

## 2022-02-14 MED ORDER — PROMETHAZINE-DM 6.25-15 MG/5ML PO SYRP: 5.0000 mL | ORAL_SOLUTION | Freq: Four times a day (QID) | ORAL | 1 refills | Status: DC | PRN

## 2022-02-14 MED ORDER — AZITHROMYCIN 250 MG PO TABS: ORAL_TABLET | ORAL | 1 refills | Status: DC

## 2022-02-14 NOTE — Patient Instructions (Signed)
Immue support reviewed Vit D, Vit C and ZInc Take tylenol PRN temp 101+ Push hydration Regular ambulation or calf exercises exercises for clot prevention and 81 mg ASA unless contraindicated Sx supportive therapy suggested Follow up via mychart or telephone if needed Advised patient obtain O2 monitor; present to ED if persistently <90% or with severe dyspnea, CP, fever uncontrolled by tylenol, confusion, sudden decline Should remain in isolation 5 days from testing positive and then wear a mask when around other people for the following 5 days

## 2022-02-14 NOTE — Progress Notes (Signed)
THIS ENCOUNTER IS A VIRTUAL VISIT DUE TO COVID-19 - PATIENT WAS NOT SEEN IN THE OFFICE.  PATIENT HAS CONSENTED TO VIRTUAL VISIT / TELEMEDICINE VISIT   Virtual Visit via telephone Note  I connected with  Cherlynn Polo on 02/14/2022 by telephone.  I verified that I am speaking with the correct person using two identifiers.    I discussed the limitations of evaluation and management by telemedicine and the availability of in person appointments. The patient expressed understanding and agreed to proceed.  History of Present Illness:  Temp (!) 100.8 F (38.2 C)  37 y.o. patient contacted office reporting URI sx . he tested positive by home test. OV was conducted by telephone to minimize exposure. This patient was vaccinated for covid 19, last first 2 vaccines   Sx began 1 days ago with Fever/shills, congestion, cough- yellow phlegm, sore throat, headache, fatigue and body aches  Treatments tried so far: tylenol cold and flu  Exposures: Mom   Medications   Current Outpatient Medications (Cardiovascular):    bisoprolol-hydrochlorothiazide (ZIAC) 5-6.25 MG tablet, Take  1 tablet  Daily  for BP   rosuvastatin (CRESTOR) 5 MG tablet, TAKE ONE TABLET BY MOUTH DAILY     Current Outpatient Medications (Other):    buPROPion (WELLBUTRIN XL) 300 MG 24 hr tablet, Take  1 tablet  every Morning for Mood, Focus & Concentration   busPIRone (BUSPAR) 5 MG tablet, TAKE ONE TABLET BY MOUTH THREE TIMES A DAY   famotidine (PEPCID) 40 MG tablet, Take  1 table t at Bedtime to Prevent Heartburn & Acid Indigestion                                 /                              TAKE                      BY MOUTH   FLUoxetine (PROZAC) 20 MG capsule, Take  1 capsule  Daily  for Mood                                                /                                   TAKE                                     BY                            MOUTH   omeprazole (PRILOSEC) 40 MG capsule, Take  1 capsule  Daily  to Prevent  Heartburn & Indigestion   sucralfate (CARAFATE) 1 g tablet, TAKE 1 TABLET BY MOUTH AT BEDTIME (Patient not taking: Reported on 02/14/2022)  Allergies: No Known Allergies  Problem list He has Obesity; Labile hypertension; Hyperlipidemia, mixed; Testosterone deficiency; Vitamin B12 deficiency; and Vitamin D deficiency on their problem list.   Social History:   reports that he  quit smoking about 19 years ago. His smoking use included cigarettes. He has never used smokeless tobacco. He reports current alcohol use. He reports that he does not use drugs.  Observations/Objective:  General : Well sounding patient in no apparent distress HEENT: no hoarseness, no cough for duration of visit Lungs: speaks in complete sentences, no audible wheezing, no apparent distress Neurological: alert, oriented x 3 Psychiatric: pleasant, judgement appropriate   Assessment and Plan:  Covid 19 Covid 19 positive per rapid screening test at home today Risk factors include: has Obesity; Labile hypertension; Hyperlipidemia, mixed; Testosterone deficiency; Vitamin B12 deficiency; and Vitamin D deficiency on their problem list.  Symptoms are: mild Immue support reviewed Vit D, Vit C and ZInc Take tylenol PRN temp 101+ Push hydration Regular ambulation or calf exercises exercises for clot prevention and 81 mg ASA unless contraindicated Sx supportive therapy suggested Follow up via mychart or telephone if needed Advised patient obtain O2 monitor; present to ED if persistently <90% or with severe dyspnea, CP, fever uncontrolled by tylenol, confusion, sudden decline Should remain in isolation 5 days from testing positive and then wear a mask when around other people for the following 5 days   Follow Up Instructions:  I discussed the assessment and treatment plan with the patient. The patient was provided an opportunity to ask questions and all were answered. The patient agreed with the plan and demonstrated an  understanding of the instructions.   The patient was advised to call back or seek an in-person evaluation if the symptoms worsen or if the condition fails to improve as anticipated.  I provided 20 minutes of non-face-to-face time during this encounter.   Alycia Rossetti, NP

## 2022-03-03 ENCOUNTER — Other Ambulatory Visit: Payer: Self-pay

## 2022-03-03 MED ORDER — FAMOTIDINE 40 MG PO TABS: ORAL_TABLET | ORAL | 3 refills | Status: DC

## 2022-03-09 NOTE — Progress Notes (Deleted)
FOLLOW UP  Assessment and Plan:   Hypertension Well controlled with current medications  Monitor blood pressure at home; patient to call if consistently greater than 130/80 Continue DASH diet.   Reminder to go to the ER if any CP, SOB, nausea, dizziness, severe HA, changes vision/speech, left arm numbness and tingling and jaw pain.  Hyperlipidemia Currently not at goal; Will recheck today and if remains elevated will start Crestor 5 mg daily Continue low cholesterol diet and exercise.  Check lipid panel.   Abnormal glucose Continue medication: Continue diet and exercise.  Perform daily foot/skin check, notify office of any concerning changes.  Check A1C  GERD Continue Famotidine and behavior modifications  Depression Currently not controlled with Lexapro and wellbutrin D/C Lexapro and begin Effexor '75mg'$  QD Denies suicidal/homicidal ideation Monitor symptoms and if they are not improving with new medication call the office Follow up in 3 months   Medication Management Magnesium  Vitamin D Def Currently taking Vit D 2000 units 3 tabs daily Check Vit D level  Elevated LFTs - CMP  Continue diet and meds as discussed. Further disposition pending results of labs. Discussed med's effects and SE's.   Over 30 minutes of exam, counseling, chart review, and critical decision making was performed.   Future Appointments  Date Time Provider [place]  03/10/2022 11:00 AM Alycia Rossetti, NP [facility] None  07/27/2022  2:00 PM Alycia Rossetti, NP [facility] None    ----------------------------------------------------------------------------------------------------------------------  HPI 37 y.o. male  presents for 3 month follow up on hypertension, cholesterol, diabetes, weight and vitamin D deficiency.   BMI is There is no height or weight on file to calculate BMI., he has not been working on diet and exercise. Wt Readings from Last 3 Encounters:  07/26/21 238  lb 12.8 oz (108.3 kg)  03/23/21 245 lb (111.1 kg)  02/17/21 241 lb 12.8 oz (109.7 kg)    His blood pressure has been controlled at home, today their BP is   BP Readings from Last 3 Encounters:  07/26/21 110/78  03/23/21 114/76  02/17/21 112/80     He does not workout. He denies chest pain, shortness of breath, dizziness.   He is not on cholesterol medication . His cholesterol is not at goal. The cholesterol last visit was:   Lab Results  Component Value Date   CHOL 183 07/26/2021   HDL 37 (L) 07/26/2021   LDLCALC 94 07/26/2021   TRIG 393 (H) 07/26/2021   CHOLHDL 4.9 07/26/2021    He has been working on diet and exercise for abnormal glucose. Last A1C in the office was:  Lab Results  Component Value Date   HGBA1C 5.1 07/26/2021   Patient is on Vitamin D supplement.   Lab Results  Component Value Date   VD25OH 21 (L) 07/26/2021        Current Medications:  Current Outpatient Medications on File Prior to Visit  Medication Sig   azithromycin (ZITHROMAX) 250 MG tablet Take 2 tablets (500 mg) on  Day 1,  followed by 1 tablet (250 mg) once daily on Days 2 through 5.   bisoprolol-hydrochlorothiazide (ZIAC) 5-6.25 MG tablet Take  1 tablet  Daily  for BP   buPROPion (WELLBUTRIN XL) 300 MG 24 hr tablet Take  1 tablet  every Morning for Mood, Focus & Concentration   busPIRone (BUSPAR) 5 MG tablet TAKE ONE TABLET BY MOUTH THREE TIMES A DAY   dexamethasone (DECADRON) 1 MG tablet Take 3 tabs for 3 days,  2 tabs for 3 days 1 tab for 5 days. Take with food.   famotidine (PEPCID) 40 MG tablet Take  1 table t at Bedtime to Prevent Heartburn & Acid Indigestion                                 /                              TAKE                      BY MOUTH   FLUoxetine (PROZAC) 20 MG capsule Take  1 capsule  Daily  for Mood                                                /                                   TAKE                                     BY                            MOUTH    omeprazole (PRILOSEC) 40 MG capsule Take  1 capsule  Daily  to Prevent Heartburn & Indigestion   promethazine-dextromethorphan (PROMETHAZINE-DM) 6.25-15 MG/5ML syrup Take 5 mLs by mouth 4 (four) times daily as needed for cough.   rosuvastatin (CRESTOR) 5 MG tablet TAKE ONE TABLET BY MOUTH DAILY   sucralfate (CARAFATE) 1 g tablet Take 1 tablet (1 g total) by mouth at bedtime.   No current facility-administered medications on file prior to visit.     Allergies: No Known Allergies   Medical History:  Past Medical History:  Diagnosis Date   Anxiety    GERD (gastroesophageal reflux disease)    HTN (hypertension)    Mixed hyperlipidemia    Unspecified vitamin D deficiency    Family history- Reviewed and unchanged Social history- Reviewed and unchanged   Review of Systems:  Review of Systems  Constitutional:  Negative for chills, fever and weight loss.  HENT:  Negative for congestion and hearing loss.   Eyes:  Negative for blurred vision and double vision.  Respiratory:  Negative for cough and shortness of breath.   Cardiovascular:  Negative for chest pain, palpitations, orthopnea and leg swelling.  Gastrointestinal:  Negative for abdominal pain, constipation, diarrhea, heartburn, nausea and vomiting.  Musculoskeletal:  Positive for joint pain (knees). Negative for falls and myalgias.  Skin:  Negative for rash.  Neurological:  Negative for dizziness, tingling, tremors, loss of consciousness and headaches.  Psychiatric/Behavioral:  Positive for depression. Negative for memory loss and suicidal ideas.       Physical Exam: There were no vitals taken for this visit. Wt Readings from Last 3 Encounters:  07/26/21 238 lb 12.8 oz (108.3 kg)  03/23/21 245 lb (111.1 kg)  02/17/21 241 lb 12.8 oz (109.7 kg)   General Appearance: Well nourished, in no apparent distress. Eyes: PERRLA, EOMs,  conjunctiva no swelling or erythema Sinuses: No Frontal/maxillary tenderness ENT/Mouth: Ext aud  canals clear, TMs without erythema, bulging. No erythema, swelling, or exudate on post pharynx.  Tonsils not swollen or erythematous. Hearing normal.  Neck: Supple, thyroid normal.  Respiratory: Respiratory effort normal, BS equal bilaterally without rales, rhonchi, wheezing or stridor.  Cardio: RRR with no MRGs. Brisk peripheral pulses without edema.  Abdomen: Soft, + BS.  Non tender, no guarding, rebound, hernias, masses. Lymphatics: Non tender without lymphadenopathy.  Musculoskeletal: Full ROM, 5/5 strength, Normal gait. Left knee crepitus with extension.  Skin: Warm, dry without rashes, lesions, ecchymosis.  Neuro: Cranial nerves intact. No cerebellar symptoms.  Psych: Awake and oriented X 3, normal affect, Insight and Judgment appropriate.    Alycia Rossetti, NP 2:21 PM [facility]

## 2022-03-10 ENCOUNTER — Ambulatory Visit: Payer: 59 | Admitting: Nurse Practitioner

## 2022-03-10 DIAGNOSIS — I10 Essential (primary) hypertension: Secondary | ICD-10-CM

## 2022-03-10 DIAGNOSIS — F321 Major depressive disorder, single episode, moderate: Secondary | ICD-10-CM

## 2022-03-10 DIAGNOSIS — K219 Gastro-esophageal reflux disease without esophagitis: Secondary | ICD-10-CM

## 2022-03-10 DIAGNOSIS — F419 Anxiety disorder, unspecified: Secondary | ICD-10-CM

## 2022-03-10 DIAGNOSIS — E782 Mixed hyperlipidemia: Secondary | ICD-10-CM

## 2022-03-10 DIAGNOSIS — R7309 Other abnormal glucose: Secondary | ICD-10-CM

## 2022-03-10 DIAGNOSIS — Z79899 Other long term (current) drug therapy: Secondary | ICD-10-CM

## 2022-03-10 DIAGNOSIS — E559 Vitamin D deficiency, unspecified: Secondary | ICD-10-CM

## 2022-03-10 DIAGNOSIS — R7989 Other specified abnormal findings of blood chemistry: Secondary | ICD-10-CM

## 2022-04-25 ENCOUNTER — Other Ambulatory Visit: Payer: Self-pay | Admitting: Nurse Practitioner

## 2022-04-25 DIAGNOSIS — E782 Mixed hyperlipidemia: Secondary | ICD-10-CM

## 2022-05-21 ENCOUNTER — Other Ambulatory Visit: Payer: Self-pay | Admitting: Nurse Practitioner

## 2022-05-21 DIAGNOSIS — K219 Gastro-esophageal reflux disease without esophagitis: Secondary | ICD-10-CM

## 2022-07-03 ENCOUNTER — Other Ambulatory Visit: Payer: Self-pay | Admitting: Nurse Practitioner

## 2022-07-03 DIAGNOSIS — K219 Gastro-esophageal reflux disease without esophagitis: Secondary | ICD-10-CM

## 2022-07-12 ENCOUNTER — Other Ambulatory Visit: Payer: Self-pay | Admitting: Nurse Practitioner

## 2022-07-12 DIAGNOSIS — I10 Essential (primary) hypertension: Secondary | ICD-10-CM

## 2022-07-27 ENCOUNTER — Encounter: Payer: 59 | Admitting: Nurse Practitioner

## 2022-08-14 ENCOUNTER — Other Ambulatory Visit: Payer: Self-pay | Admitting: Internal Medicine

## 2022-08-14 DIAGNOSIS — F32A Depression, unspecified: Secondary | ICD-10-CM

## 2022-08-14 MED ORDER — FLUOXETINE HCL 20 MG PO CAPS: ORAL_CAPSULE | ORAL | 3 refills | Status: DC

## 2022-08-17 NOTE — Progress Notes (Deleted)
 Complete Physical  Assessment and Plan:  Peter Le was seen today for annual exam.  Diagnoses and all orders for this visit:  Encounter for general adult medical examination with abnormal findings Due yearly  Essential hypertension - continue medications, DASH diet, exercise and monitor at home. Call if greater than 130/80.   Go to the ER if any chest pain, shortness of breath, nausea, dizziness, severe HA, changes vision/speech  -     CBC with Differential/Platelet -     COMPLETE METABOLIC PANEL WITH GFR  Gastroesophageal reflux disease, unspecified whether esophagitis present Continue Prilosec in am and Famotidine in PM.  Add carafate before bed If symptoms persist will refer to GI for possible endoscopy -     Magnesium -     sucralfate (CARAFATE) 1 g tablet; Take 1 tablet (1 g total) by mouth at bedtime.  Hyperlipidemia, mixed Continue medication, diet and exercsie -     CBC with Differential/Platelet -     COMPLETE METABOLIC PANEL WITH GFR -     Lipid panel  Abnormal glucose Continue diet and exercise, focus on weight loss -     Hemoglobin A1c  Current moderate episode of major depressive disorder, unspecified whether recurrent (HCC)/Anxiety Currently well controlled with Wellbutrin 300mg  , Prozac 20 mg and Buspar 5 mg TID PRN Continue diet and exercise  Vitamin D deficiency Depending on result will recommend Vit D 3 supplementation -     VITAMIN D 25 Hydroxy (Vit-D Deficiency, Fractures)  Obesity Long discussion about weight loss, diet, and exercise Recommended diet heavy in fruits and veggies and low in animal meats, cheeses, and dairy products, appropriate calorie intake Patient will work on *** Discussed appropriate weight for height (below***) and initial goal (***) Follow up at next visit  Medication management -     CBC with Differential/Platelet -     COMPLETE METABOLIC PANEL WITH GFR -     Magnesium -     Lipid panel -     TSH -     Hemoglobin A1c -      VITAMIN D 25 Hydroxy (Vit-D Deficiency, Fractures) -     EKG 12-Lead -     Urinalysis, Routine w reflex microscopic -     Microalbumin / creatinine urine ratio  Screening for ischemic heart disease -     EKG 12-Lead  Screening for thyroid disorder -     TSH  Screening for hematuria or proteinuria -     Urinalysis, Routine w reflex microscopic -     Microalbumin / creatinine urine ratio     Discussed med's effects and SE's. Screening labs and tests as requested with regular follow-up as recommended. Over 40 minutes of exam, counseling, chart review and critical decision making was performed  HPI Patient presents for a complete physical.   His blood pressure has been controlled at home, today their BP is    BP Readings from Last 3 Encounters:  07/26/21 110/78  03/23/21 114/76  02/17/21 112/80  He does workout. He denies chest pain, shortness of breath, dizziness.   BMI is There is no height or weight on file to calculate BMI., he has been working on diet and exercise. He is drinking less sweet drinks  and drinking more water.  Wt Readings from Last 3 Encounters:  07/26/21 238 lb 12.8 oz (108.3 kg)  03/23/21 245 lb (111.1 kg)  02/17/21 241 lb 12.8 oz (109.7 kg)   He continues to have issues with heartburn and  is using Omeprazole 40 mg QAM and Famotidine 40 mg in the evening.  He has not been able to pinpoint a dietary trigger.   He is on cholesterol medication , Rosuvastatin 5 mg QD and denies myalgias. His cholesterol is at goal. The cholesterol last visit was:   Lab Results  Component Value Date   CHOL 183 07/26/2021   HDL 37 (L) 07/26/2021   LDLCALC 94 07/26/2021   TRIG 393 (H) 07/26/2021   CHOLHDL 4.9 07/26/2021   He has been working on diet and exercise for abnormal glucose . Last A1C in the office was:  Lab Results  Component Value Date   HGBA1C 5.1 07/26/2021   Last GFR: Lab Results  Component Value Date   EGFR 80 07/26/2021      Patient is on Vitamin D  supplement.   Lab Results  Component Value Date   VD25OH 21 (L) 07/26/2021       Current Medications:  Current Outpatient Medications on File Prior to Visit  Medication Sig Dispense Refill   azithromycin (ZITHROMAX) 250 MG tablet Take 2 tablets (500 mg) on  Day 1,  followed by 1 tablet (250 mg) once daily on Days 2 through 5. 6 each 1   bisoprolol-hydrochlorothiazide (ZIAC) 5-6.25 MG tablet TAKE ONE TABLET BY MOUTH DAILY FOR BLOOD PRESSURE 30 tablet 5   buPROPion (WELLBUTRIN XL) 300 MG 24 hr tablet Take  1 tablet  every Morning for Mood, Focus & Concentration 90 tablet 3   busPIRone (BUSPAR) 5 MG tablet TAKE ONE TABLET BY MOUTH THREE TIMES A DAY 270 tablet 1   dexamethasone (DECADRON) 1 MG tablet Take 3 tabs for 3 days, 2 tabs for 3 days 1 tab for 5 days. Take with food. 20 tablet 0   famotidine (PEPCID) 40 MG tablet Take  1 table t at Bedtime to Prevent Heartburn & Acid Indigestion                                 /                              TAKE                      BY MOUTH 90 tablet 3   FLUoxetine (PROZAC) 20 MG capsule Take  1 capsule  Daily  for Mood                                                /                                   TAKE                                     BY                            MOUTH 90 capsule 3   omeprazole (PRILOSEC) 40 MG capsule TAKE 1 CAPSULE BY MOUTH DAILY TO PREVENT HEARTBURN AND INDIGESTION 90 capsule  3   promethazine-dextromethorphan (PROMETHAZINE-DM) 6.25-15 MG/5ML syrup Take 5 mLs by mouth 4 (four) times daily as needed for cough. 240 mL 1   rosuvastatin (CRESTOR) 5 MG tablet TAKE 1 TABLET BY MOUTH DAILY 90 tablet 1   sucralfate (CARAFATE) 1 g tablet TAKE 1 TABLET BY MOUTH EVERY NIGHT AT BEDTIME 30 tablet 1   No current facility-administered medications on file prior to visit.   Allergies:  No Known Allergies Health Maintenance:  Immunization History  Administered Date(s) Administered   Influenza Inj Mdck Quad Pf 11/15/2016    Influenza-Unspecified 10/02/2017   PPD Test 03/21/2018, 07/23/2019   Pneumococcal Polysaccharide-23 06/23/2010   Tdap 06/23/2010    Tetanus: Pneumovax: Prevnar 13: Flu vaccine: Zostavax:  DEXA: Colonoscopy: EGD: Eye Exam: Dentist:  Patient Care Team: Lucky Cowboy, MD as PCP - General (Internal Medicine) Iva Boop, MD as Consulting Physician (Gastroenterology)  Medical History:  has Obesity; Labile hypertension; Hyperlipidemia, mixed; Testosterone deficiency; Vitamin B12 deficiency; and Vitamin D deficiency on their problem list. Surgical History:  He  has a past surgical history that includes Hernia repair (age 29) and Cholecystectomy (2008). Family History:  His family history includes Hypertension in his father. Social History:   reports that he quit smoking about 19 years ago. His smoking use included cigarettes. He has never used smokeless tobacco. He reports current alcohol use. He reports that he does not use drugs. Review of Systems:  Review of Systems  Constitutional:  Negative for chills and fever.  HENT:  Negative for congestion, hearing loss, sinus pain, sore throat and tinnitus.   Eyes:  Negative for blurred vision and double vision.  Respiratory:  Negative for cough, hemoptysis, sputum production, shortness of breath and wheezing.   Cardiovascular:  Negative for chest pain, palpitations and leg swelling.  Gastrointestinal:  Positive for heartburn. Negative for abdominal pain, constipation, diarrhea, nausea and vomiting.  Genitourinary:  Negative for dysuria and urgency.  Musculoskeletal:  Negative for back pain, falls, joint pain, myalgias and neck pain.  Skin:  Negative for rash.  Neurological:  Negative for dizziness, tingling, tremors, weakness and headaches.  Endo/Heme/Allergies:  Does not bruise/bleed easily.  Psychiatric/Behavioral:  Negative for depression and suicidal ideas. The patient is not nervous/anxious and does not have insomnia.      Physical Exam: Estimated body mass index is 32.39 kg/m as calculated from the following:   Height as of 07/26/21: 6' (1.829 m).   Weight as of 07/26/21: 238 lb 12.8 oz (108.3 kg). There were no vitals taken for this visit. General Appearance: Well nourished, in no apparent distress.  Eyes: PERRLA, EOMs, conjunctiva no swelling or erythema, normal fundi and vessels.  Sinuses: No Frontal/maxillary tenderness  ENT/Mouth: Ext aud canals clear, normal light reflex with TMs without erythema, bulging. Good dentition. No erythema, swelling, or exudate on post pharynx. Tonsils not swollen or erythematous. Hearing normal.  Neck: Supple, thyroid normal. No bruits  Respiratory: Respiratory effort normal, BS equal bilaterally without rales, rhonchi, wheezing or stridor.  Cardio: RRR without murmurs, rubs or gallops. Brisk peripheral pulses without edema.  Chest: symmetric, with normal excursions and percussion.  Abdomen: Soft, nontender, no guarding, rebound, hernias, masses, or organomegaly.  Lymphatics: Non tender without lymphadenopathy.  Genitourinary:  Musculoskeletal: Full ROM all peripheral extremities,5/5 strength, and normal gait.  Skin: Warm, dry without rashes, lesions, ecchymosis. Neuro: Cranial nerves intact, reflexes equal bilaterally. Normal muscle tone, no cerebellar symptoms. Sensation intact.  Psych: Awake and oriented X 3, normal affect, Insight and Judgment  appropriate.   EKG: NSR, no ST changes   Peter Le E  9:15 AM  [facility]

## 2022-08-18 ENCOUNTER — Encounter: Payer: 59 | Admitting: Nurse Practitioner

## 2022-11-19 ENCOUNTER — Other Ambulatory Visit: Payer: Self-pay | Admitting: Nurse Practitioner

## 2022-11-19 DIAGNOSIS — E782 Mixed hyperlipidemia: Secondary | ICD-10-CM

## 2022-12-23 ENCOUNTER — Other Ambulatory Visit: Payer: Self-pay | Admitting: Nurse Practitioner

## 2022-12-23 DIAGNOSIS — E782 Mixed hyperlipidemia: Secondary | ICD-10-CM

## 2023-01-13 ENCOUNTER — Other Ambulatory Visit: Payer: Self-pay | Admitting: Nurse Practitioner

## 2023-01-13 DIAGNOSIS — F32A Depression, unspecified: Secondary | ICD-10-CM

## 2023-02-21 ENCOUNTER — Other Ambulatory Visit: Payer: Self-pay

## 2023-02-21 DIAGNOSIS — F32A Depression, unspecified: Secondary | ICD-10-CM

## 2023-02-21 DIAGNOSIS — I10 Essential (primary) hypertension: Secondary | ICD-10-CM

## 2023-02-21 MED ORDER — BUPROPION HCL ER (XL) 300 MG PO TB24: 300.0000 mg | ORAL_TABLET | Freq: Every morning | ORAL | 0 refills | Status: DC

## 2023-02-21 MED ORDER — BISOPROLOL-HYDROCHLOROTHIAZIDE 5-6.25 MG PO TABS: ORAL_TABLET | ORAL | 3 refills | Status: DC

## 2023-02-22 ENCOUNTER — Other Ambulatory Visit: Payer: Self-pay

## 2023-02-22 DIAGNOSIS — E782 Mixed hyperlipidemia: Secondary | ICD-10-CM

## 2023-02-22 MED ORDER — ROSUVASTATIN CALCIUM 5 MG PO TABS: 5.0000 mg | ORAL_TABLET | Freq: Every day | ORAL | 1 refills | Status: DC

## 2023-03-06 ENCOUNTER — Other Ambulatory Visit: Payer: Self-pay

## 2023-03-06 MED ORDER — FAMOTIDINE 40 MG PO TABS: ORAL_TABLET | ORAL | 0 refills | Status: DC

## 2023-03-21 ENCOUNTER — Other Ambulatory Visit: Payer: Self-pay

## 2023-03-21 DIAGNOSIS — F32A Depression, unspecified: Secondary | ICD-10-CM

## 2023-03-21 MED ORDER — BUPROPION HCL ER (XL) 300 MG PO TB24: 300.0000 mg | ORAL_TABLET | Freq: Every morning | ORAL | 0 refills | Status: DC

## 2023-03-23 ENCOUNTER — Ambulatory Visit: Payer: BC Managed Care – PPO | Admitting: Internal Medicine

## 2023-03-23 ENCOUNTER — Encounter: Payer: Self-pay | Admitting: Internal Medicine

## 2023-03-23 VITALS — BP 128/85 | HR 73 | Temp 98.3°F | Ht 72.0 in | Wt 228.2 lb

## 2023-03-23 DIAGNOSIS — E782 Mixed hyperlipidemia: Secondary | ICD-10-CM | POA: Diagnosis not present

## 2023-03-23 DIAGNOSIS — E559 Vitamin D deficiency, unspecified: Secondary | ICD-10-CM

## 2023-03-23 DIAGNOSIS — F439 Reaction to severe stress, unspecified: Secondary | ICD-10-CM | POA: Insufficient documentation

## 2023-03-23 DIAGNOSIS — R0989 Other specified symptoms and signs involving the circulatory and respiratory systems: Secondary | ICD-10-CM

## 2023-03-23 DIAGNOSIS — E349 Endocrine disorder, unspecified: Secondary | ICD-10-CM | POA: Diagnosis not present

## 2023-03-23 DIAGNOSIS — K219 Gastro-esophageal reflux disease without esophagitis: Secondary | ICD-10-CM

## 2023-03-23 DIAGNOSIS — I10 Essential (primary) hypertension: Secondary | ICD-10-CM

## 2023-03-23 DIAGNOSIS — E66812 Obesity, class 2: Secondary | ICD-10-CM

## 2023-03-23 DIAGNOSIS — R7989 Other specified abnormal findings of blood chemistry: Secondary | ICD-10-CM

## 2023-03-23 DIAGNOSIS — E538 Deficiency of other specified B group vitamins: Secondary | ICD-10-CM

## 2023-03-23 DIAGNOSIS — F419 Anxiety disorder, unspecified: Secondary | ICD-10-CM | POA: Insufficient documentation

## 2023-03-23 DIAGNOSIS — F32A Depression, unspecified: Secondary | ICD-10-CM

## 2023-03-23 DIAGNOSIS — G4733 Obstructive sleep apnea (adult) (pediatric): Secondary | ICD-10-CM

## 2023-03-23 MED ORDER — FLUOXETINE HCL 40 MG PO CAPS: 40.0000 mg | ORAL_CAPSULE | Freq: Every day | ORAL | 3 refills | Status: AC

## 2023-03-23 MED ORDER — BISOPROLOL-HYDROCHLOROTHIAZIDE 5-6.25 MG PO TABS
ORAL_TABLET | ORAL | 3 refills | Status: DC
Start: 2023-03-23 — End: 2023-09-21

## 2023-03-23 MED ORDER — BUPROPION HCL ER (XL) 300 MG PO TB24: 300.0000 mg | ORAL_TABLET | Freq: Every morning | ORAL | 0 refills | Status: DC

## 2023-03-23 MED ORDER — ROSUVASTATIN CALCIUM 5 MG PO TABS: 5.0000 mg | ORAL_TABLET | Freq: Every day | ORAL | 3 refills | Status: AC

## 2023-03-23 MED ORDER — BUSPIRONE HCL 5 MG PO TABS: 5.0000 mg | ORAL_TABLET | Freq: Three times a day (TID) | ORAL | 3 refills | Status: AC

## 2023-03-23 MED ORDER — FAMOTIDINE 40 MG PO TABS: ORAL_TABLET | ORAL | 3 refills | Status: AC

## 2023-03-23 MED ORDER — OMEPRAZOLE 40 MG PO CPDR: DELAYED_RELEASE_CAPSULE | ORAL | 3 refills | Status: AC

## 2023-03-23 MED ORDER — SUCRALFATE 1 G PO TABS: 1.0000 g | ORAL_TABLET | Freq: Every day | ORAL | 1 refills | Status: AC

## 2023-03-23 MED ORDER — VITAMIN D (ERGOCALCIFEROL) 1.25 MG (50000 UNIT) PO CAPS: 50000.0000 [IU] | ORAL_CAPSULE | ORAL | 1 refills | Status: AC

## 2023-03-23 NOTE — Assessment & Plan Note (Signed)
 Chronic GERD with LPR symptoms, including nocturnal acid reflux, is managed with omeprazole and famotidine. We discussed a potential GI referral if symptoms persist and advised Pepcid Complete at bedtime. Addressing underlying causes is crucial to prevent complications like ulcers, malnutrition, and cancer. Helicobacter pylori testing was discussed, with a possible GI referral if no improvement occurs. Order Helicobacter pylori stool antigen test and prescribe Pepcid Complete at bedtime. Refill omeprazole and famotidine for one year and prescribe sucralfate for symptomatic relief. Follow-up in one month to review test results and symptom management.

## 2023-03-23 NOTE — Patient Instructions (Signed)
 After Visit Summary - Patient Instructions Thank you for your visit today. This summary outlines the key points we discussed and your treatment plan moving forward. YOUR DIAGNOSES Anxiety and Depression Gastroesophageal Reflux Disease (GERD) Laryngopharyngeal Reflux (LPR) Hypertension Mixed Hyperlipidemia (high cholesterol and triglycerides) Obesity Suspected Sleep Apnea Vitamin D Deficiency Elevated Liver Enzymes Borderline Low Testosterone MEDICATION CHANGES New Medications: Fluoxetine (Prozac) 40 mg - Take 1 capsule by mouth daily This is an increase from your previous 20 mg dose Vitamin D 50,000 IU - Take 1 capsule by mouth once weekly Restarted Medications: Buspirone (Buspar) 5 mg - Take 1 tablet by mouth three times daily Sucralfate (Carafate) 1 g - Take 1 tablet by mouth at bedtime Continued Medications: Bupropion (Wellbutrin XL) 300 mg - Take 1 tablet by mouth every morning Bisoprolol-hydrochlorothiazide (Ziac) 5-6.25 mg - Take 1 tablet by mouth daily Omeprazole (Prilosec) 40 mg - Take 1 capsule by mouth daily in the morning Famotidine (Pepcid) 40 mg - Take 1 tablet by mouth at bedtime Rosuvastatin (Crestor) 5 mg - Take 1 tablet by mouth daily TESTS ORDERED Stool test for H. pylori - This test will help determine if a bacteria is contributing to your reflux symptoms  Please pick up the collection kit at the lab and return it as instructed REFERRALS Sleep Medicine - For evaluation of possible sleep apnea  Our office will contact you with appointment details Continue using your over-the-counter mouthpiece until your evaluation FOLLOW-UP APPOINTMENTS One Month Follow-Up - To check on your response to medication changes and GERD symptoms Three Month Follow-Up - For comprehensive health assessment including lab work DIETARY RECOMMENDATIONS For GERD/Reflux: Avoid eating within 3 hours of bedtime Elevate the head of your bed 6-8 inches Limit spicy foods, acidic foods,  caffeine, chocolate, and alcohol Eat smaller, more frequent meals For Cholesterol/Triglycerides: Reduce intake of refined carbohydrates (white bread, pasta, rice, potatoes) Increase omega-3 fatty acids through fatty fish (salmon, mackerel) Choose olive oil over other cooking oils Increase fiber through vegetables, fruits, and whole grains For Weight Management: Continue your current successful approach to weight loss Focus on protein sources and non-starchy vegetables Limit processed foods and sugar-sweetened beverages Goal: additional 10-15 pound weight loss over next six months LIFESTYLE RECOMMENDATIONS Physical Activity: Aim for 150 minutes per week of moderate activity Stress Management: Consider mindfulness practices or other stress reduction techniques Sleep: Maintain regular sleep schedule and follow good sleep hygiene practices WHEN TO SEEK MEDICAL ATTENTION Contact our office if you experience: Worsening depression, anxiety, or thoughts of harming yourself Severe chest pain or difficulty breathing Blood in stool or vomit Severe headache with elevated blood pressure Unusual side effects from new medications IMPORTANT REMINDERS Take your blood pressure readings regularly and keep a log Complete your H. pylori test within the next week Your medications have been refilled for one year, but please schedule follow-up appointments for continued care You're due for your Tdap vaccination and annual influenza vaccine Call our office at [facility] Phone Number] if you have questions about your care plan or need to reschedule appointments.

## 2023-03-23 NOTE — Assessment & Plan Note (Signed)
 Assessment: Chronic GERD with LPR symptoms, notably nocturnal acid reflux despite current management with omeprazole in the morning and famotidine at night. Previously used sucralfate for nighttime control. Reports waking up with acid in throat. This suggests inadequate control with current regimen. Symptoms significantly impact sleep quality, which may compound anxiety/depression symptoms. Plan: Continue omeprazole 40mg  daily in the morning Continue famotidine 40mg  at bedtime Restart sucralfate 1g at bedtime for additional protection against nocturnal reflux Order Helicobacter pylori stool antigen test to rule out H. pylori as an underlying cause Discussed potential for GI referral if symptoms persist despite optimized medical therapy Recommended dietary modifications: avoid eating within 3 hours of bedtime, elevate head of bed Discussed importance of addressing GERD to prevent complications such as Barrett's esophagus Follow up in one month to review test results and symptom response

## 2023-03-23 NOTE — Assessment & Plan Note (Signed)
 Assessment: History of labile hypertension with home monitoring. Recent readings 134/93 and 128/85, indicating borderline control. Currently on bisoprolol-hydrochlorothiazide 5-6.25mg  daily. No symptoms of end-organ damage. Blood pressure may be affected by concurrent GERD, sleep apnea, and stress/anxiety. Plan: Continue bisoprolol-hydrochlorothiazide 5-6.25mg  daily Encourage continued home blood pressure monitoring, recording in log Recommended lifestyle modifications including sodium restriction and regular exercise Address contributing factors: optimize GERD management and evaluate for sleep apnea Follow up in three months to reassess blood pressure control Target blood pressure <130/80

## 2023-03-23 NOTE — Assessment & Plan Note (Signed)
 Assessment: Persistent mild elevation of liver enzymes with most recent ALT of 48 U/L (elevated) and AST 30 U/L (normal). Previous ALT values have fluctuated between normal and elevated. 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 Plan: Recommended weight loss and reduction in refined carbohydrates Emphasized importance of reducing triglyceride levels Avoid alcohol and hepatotoxic medications Monitor liver enzymes with follow-up labs in three months Consider ultrasound evaluation if enzymes continue to rise

## 2023-03-23 NOTE — Assessment & Plan Note (Signed)
 Assessment: Prior testosterone levels borderline low (323 ng/dL in 4098, 119 ng/dL in 1478). Not currently on testosterone replacement therapy. May contribute to fatigue, mood issues, and metabolic parameters. Plan: Will recheck morning total testosterone along with SHBG, free testosterone Discussed lifestyle factors that may improve testosterone levels naturally Will consider referral to endocrinology if levels remain low and symptoms persist Deferred testosterone replacement pending updated levels

## 2023-03-23 NOTE — Assessment & Plan Note (Signed)
 Assessment: Suspected obstructive sleep apnea based on spouse's observations of snoring and gasping during sleep. Patient using over-the-counter mouthpiece with some improvement in snoring. Untreated sleep apnea may contribute to hypertension, metabolic issues, and fatigue. Plan: Referred to sleep medicine for formal evaluation and home sleep study Discussed treatment options including CPAP, dental appliances, and surgical approaches Emphasized importance of diagnosis and treatment for cardiovascular and metabolic health Recommended continued use of mouthpiece until formal evaluation completed Weight loss may help reduce severity of sleep-disordered breathing

## 2023-03-23 NOTE — Assessment & Plan Note (Signed)
 Assessment: Laboratory findings show mixed hyperlipidemia with elevated triglycerides (393 mg/dL), low HDL (37 mg/dL), and calculated LDL of 94 mg/dL. Total cholesterol to HDL ratio is elevated at 4.9. Currently on rosuvastatin 5mg  daily. Diet described as standard American diet with regular carbohydrate intake. Elevated triglycerides may be contributing to fatty liver and elevated liver enzymes. Plan: Continue rosuvastatin 5mg  daily Emphasized dietary modifications: reduce refined carbohydrates and increase omega-3 fatty acids Discussed relationship between carbohydrate intake and triglyceride levels Recommended Mediterranean diet pattern with emphasis on olive oil, fish, nuts, and vegetables Goal to reduce triglycerides to <150 mg/dL and increase HDL >62 mg/dL Repeat lipid panel in three months to assess response Will consider adding specific triglyceride-lowering therapy if levels remain elevated

## 2023-03-23 NOTE — Assessment & Plan Note (Signed)
 Anxiety and depression are managed with Wellbutrin and fluoxetine. He reports increased irritability and edginess. Previously, he used buspirone as needed. We discussed increasing the fluoxetine dosage and adding daily buspirone. The importance of gradual tapering to avoid withdrawal symptoms was explained. Near 100% improvement is anticipated with the adjusted regimen. Increase fluoxetine to 40 mg daily and prescribe buspirone for daily use. Refill Wellbutrin for one year. Follow-up in one month to assess anxiety management.

## 2023-03-23 NOTE — Assessment & Plan Note (Signed)
 Assessment: Patient with history of anxiety and depression since 2008, currently managed with bupropion 300mg  daily and fluoxetine 20mg  daily. He reports feeling "quicker to snap" and "a little edgy" recently, suggesting suboptimal control. Previously used buspirone as needed. Denies erectile dysfunction from fluoxetine. Stress primarily related to managing work and family responsibilities with three children. PHQ-2 scores have been 0 across multiple screenings, suggesting his symptoms may present more as irritability than traditional depression. Plan: Increase fluoxetine to 40mg  daily to better manage increasing anxiety/depression symptoms Add buspirone 5mg  three times daily for additional anxiety control Continue bupropion XL 300mg  daily Discussed importance of gradual medication adjustments to avoid withdrawal symptoms Anticipate significant improvement with adjusted regimen Follow up in one month to assess response to medication changes Refilled all psychiatric medications for one year with the caveat of scheduling appointments for further refills

## 2023-03-23 NOTE — Progress Notes (Signed)
 [facility]  Phone: [phone]  - Medical Office Visit -  Visit Date: 03/23/2023 Patient: Peter Le   DOB: [birth year]   38 y.o. Male  MRN: [id number] Patient Care Team: Lula Olszewski, MD as PCP - General (Internal Medicine) Iva Boop, MD as Consulting Physician (Gastroenterology) Today's Health Care Provider: Lula Olszewski, MD  ===========================================   Chief Complaint / Reason for Visit: New Patient (Initial Visit)  Background: 38 y.o. male who has Obesity; Labile hypertension; Hyperlipidemia, mixed; Testosterone deficiency; Vitamin B12 deficiency; Vitamin D deficiency; Stress; Elevated LFTs; Gastroesophageal reflux disease; Laryngopharyngeal reflux (LPR); Anxiety; Depression; and OSA (obstructive sleep apnea) on their problem list.  Discussed the use of AI scribe software for clinical note transcription with the patient, who gave verbal consent to proceed.  History of Present Illness   The patient is a 38 year old male presenting for transfer of care and medication management. He has a significant history of anxiety and depression since 2008, currently managed with bupropion 300mg  daily and fluoxetine 20mg  daily. While this regimen has generally been effective, he reports feeling "quicker to snap" and "a little edgy" recently. He previously used buspirone as needed for anxiety but has not taken it recently. He denies experiencing erectile dysfunction from fluoxetine.   His stress is described as a "big blob" without a single identifiable source, varying day-to-day based on household and work demands. He reports being married with three children (ages 52 to 13 years) and struggling with work-life-family balance. He feels frequently fatigued but attributes this to life circumstances rather than a medical condition.  The patient also suffers from significant gastroesophageal reflux disease (GERD) and laryngopharyngeal reflux (LPR)  with severe nocturnal symptoms. He often wakes up with acid in his throat despite taking omeprazole in the morning and famotidine in the evening. He previously used sucralfate for nighttime reflux control but has not had it recently. His GERD symptoms are most severe overnight.  Additionally, he has labile hypertension and monitors his blood pressure at home, with recent readings of 134/93 and 128/85. He is currently taking bisoprolol-hydrochlorothiazide for blood pressure control. He reports being on a standard diet including bread, potatoes, pasta, and rice, not following any specific low-carbohydrate regimen.  His wife has observed symptoms suggestive of obstructive sleep apnea including snoring and episodes of gasping for air during sleep. He has been using an over-the-counter mouthpiece which has helped reduce snoring but has not undergone formal sleep evaluation.  The patient has a documented vitamin D deficiency but is not currently taking supplements. Laboratory tests also indicate borderline low testosterone, though he is not on replacement therapy. Recent labs show elevated liver function tests and triglycerides (393 mg/dL), low HDL (37 mg/dL), and vitamin D deficiency (21 ng/mL).  He quit smoking approximately 20 years ago after a 1.5 pack-year history. He reports current alcohol use but denies drug use. Family history is significant for anxiety, depression, ADHD, hypertension, and diabetes.  His weight has shown improvement with a downward trend from 246 lbs in February 2022 to his current weight of 228 lbs, though his BMI remains elevated at 30.95 kg/m.   Problem overviews updated today: Problem  Stress   History of depression Has anxiety But prefers to call stress to an anxiety disorder. Married with 3 kids and challenges with work life family balance- nothing really specific. Takes Wellbutrin/fluoxetine.    Elevated Lfts   Current Status: Persistent mild elevation of ALT with  normal AST. Pattern most consistent with  non-alcoholic fatty liver disease, likely related to metabolic syndrome, obesity, and hypertriglyceridemia. Key Objective Data: ALT: 48 U/L (H) AST: 30 U/L Normal bilirubin and alkaline phosphatase Trending: ALT has fluctuated between normal and elevated Treatment: Weight loss and dietary modifications recommended Management of underlying metabolic factors (triglycerides, glucose) Avoidance of alcohol and hepatotoxic medications Plan: Recheck liver enzymes in three months Consider liver ultrasound if enzymes continue to rise or worsen   Lab Results  Component Value Date/Time   ALT 48 (H) 07/26/2021 02:59 PM   ALT 42 02/17/2021 03:16 PM   ALT 31 10/15/2020 11:30 AM   ALT 60 (H) 02/05/2020 03:29 PM   ALT 44 07/23/2019 02:12 PM   Lab Results  Component Value Date/Time   AST 30 07/26/2021 02:59 PM   AST 27 02/17/2021 03:16 PM   AST 21 10/15/2020 11:30 AM   AST 35 02/05/2020 03:29 PM   AST 31 07/23/2019 02:12 PM   Lab Results  Component Value Date/Time   ALKPHOS 101 07/22/2010 09:55 AM   No components found for: "BILIT" No results found for: "LABGGT"     Gastroesophageal Reflux Disease   Current Status: Chronic GERD with inadequate control on current regimen. Most significant symptoms occur at night with patient waking up with acid in throat. Symptoms impact sleep quality, which may exacerbate other conditions. No recent endoscopy documented. Key Objective Data: No prior EGD documented Elevated ALT 48 U/L may be indirectly related to metabolic syndrome No documented complications such as Barrett's esophagus or stricture Treatment: Omeprazole 40mg  daily in morning continued Famotidine 40mg  at bedtime continued Sucralfate 1g at bedtime restarted H. pylori testing ordered Lifestyle modifications recommended including dietary changes and elevation of head of bed Plan: Helicobacter pylori stool antigen testing Follow up in one month  to assess symptom response Consider GI referral if symptoms persist despite optimized medical therapy   Laryngopharyngeal Reflux (Lpr)   Current Status: Symptoms consistent with LPR secondary to inadequately controlled GERD. Manifestations include nighttime symptoms with acid in throat. No voice changes or chronic cough reported. Treatment: Managed concurrently with GERD treatment Discussed importance of medication adherence and lifestyle modifications Plan: Reassess with GERD symptoms at follow-up   Anxiety   Current Status: Chronic anxiety disorder with recent increase in symptoms manifesting as irritability and feeling "quicker to snap." Currently suboptimally controlled on fluoxetine 20mg  daily and bupropion 300mg  daily. Previously used buspirone as needed. Patient prefers to frame as "stress" rather than anxiety. PHQ-2 scores consistently 0, suggesting symptoms manifest primarily as irritability rather than classic anxiety/depression presentation. Key Events: Diagnosed 2008 Recent increase in irritability despite stable medication regimen Contributing factors include work-life balance challenges with three children (ages 58-18) Treatment: Fluoxetine increased to 40mg  daily Buspirone 5mg  TID added for daily use Bupropion XL 300mg  daily continued Non-pharmacological stress management techniques discussed Plan: Follow up in one month to assess response to medication adjustments Consider psychological therapy referral if symptoms persist   Depression   Current Status: Long-standing history of depression, currently co-managed with anxiety. Generally stable on current regimen. Denies classic depressive symptoms such as anhedonia, hopelessness, or suicidal ideation. Depression manifests primarily as fatigue and irritability, which patient attributes to life circumstances. Treatment: Fluoxetine increased to 40mg  daily Bupropion XL 300mg  daily continued Medication adjustments aim to address  both anxiety and depression Plan: Reassess at one-month follow-up Monitor for changes in symptoms with medication adjustment   Osa (Obstructive Sleep Apnea)   Current Status: Clinical suspicion based on spouse's report of snoring and gasping episodes  during sleep. Patient currently using over-the-counter mouthpiece with improvement in snoring but no formal evaluation or diagnosis. Risk factors include male gender, obesity, and middle age. Treatment: Referred to sleep medicine for formal evaluation and home sleep study Discussed treatment options including CPAP and dental appliances Recommended continued use of mouthpiece until formal evaluation Plan: Complete sleep medicine evaluation Implement recommended therapy based on study results Reassess impact on hypertension and fatigue after treatment initiated   Labile Hypertension   Current Status: Labile hypertension with recent home readings of 134/93 and 128/85. Office reading today 128/85. Currently on bisoprolol-hydrochlorothiazide 5-6.25mg  daily. Contributing factors likely include obesity, possible sleep apnea, and stress. Key Objective Data: Blood pressure trending: Current 128/85 Normal renal function with creatinine 1.19 mg/dL No evidence of end-organ damage Treatment: Continue bisoprolol-hydrochlorothiazide 5-6.25mg  daily Home blood pressure monitoring recommended Lifestyle modifications including sodium restriction discussed Plan: Continue current medication Reassess at three-month follow-up Target BP <130/80   Current hypertension medications:       Sig   bisoprolol-hydrochlorothiazide (ZIAC) 5-6.25 MG tablet TAKE ONE TABLET BY MOUTH DAILY FOR BLOOD PRESSURE      Lab Results  Component Value Date   NA 142 07/26/2021   K 4.5 07/26/2021   CREATININE 1.19 07/26/2021      Hyperlipidemia, Mixed   Current Status: Mixed dyslipidemia characterized by elevated triglycerides, low HDL, and borderline LDL. Most concerning  is marked hypertriglyceridemia (393 mg/dL). Key Objective Data: Triglycerides: 393 mg/dL (H) HDL: 37 mg/dL (L) LDL: 94 mg/dL Total cholesterol: 782 mg/dL Total cholesterol/HDL ratio: 4.9 Treatment: Continue rosuvastatin 5mg  daily Dietary modifications emphasized with focus on reducing refined carbohydrates Weight management discussed as component of lipid control Plan: Repeat lipid panel in three months Consider adding specific triglyceride-lowering agent if levels remain elevated  Lab Results  Component Value Date/Time   ALT 48 (H) 07/26/2021 02:59 PM   ALT 42 02/17/2021 03:16 PM   ALT 31 10/15/2020 11:30 AM   ALT 60 (H) 02/05/2020 03:29 PM   ALT 44 07/23/2019 02:12 PM   Lab Results  Component Value Date/Time   AST 30 07/26/2021 02:59 PM   AST 27 02/17/2021 03:16 PM   AST 21 10/15/2020 11:30 AM   AST 35 02/05/2020 03:29 PM   AST 31 07/23/2019 02:12 PM   Lab Results  Component Value Date/Time   ALKPHOS 101 07/22/2010 09:55 AM   No components found for: "BILIT" No results found for: "LABGGT"     Testosterone Deficiency   Lab Results  Component Value Date/Time   TESTOSTERONE 323 07/23/2019 02:12 PM   TESTOSTERONE 254 03/21/2018 03:52 PM   No results found for: "SHBG", "ESTRADIOL" No results found for: "LH", "FSH", "PROLACTIN" No results found for: "25OHVITD2" Lab Results  Component Value Date   AST 30 07/26/2021   ALT 48 (H) 07/26/2021   ALKPHOS 101 07/22/2010   BILITOT 0.8 07/26/2021   WBC 6.5 07/26/2021   HGB 15.2 07/26/2021   HCT 44.2 07/26/2021   HDL 37 (L) 07/26/2021   CHOLHDL 4.9 07/26/2021   LDLCALC 94 07/26/2021   TRIG 393 (H) 07/26/2021   HGBA1C 5.1 07/26/2021       Vitamin B12 Deficiency   Lab Results  Component Value Date/Time   VITAMINB12 394 07/23/2019 02:12 PM   VITAMINB12 378 03/21/2018 03:52 PM      Vitamin D Deficiency   Current Status: Chronic vitamin D deficiency with most recent level 21 ng/mL. Prior levels have been  consistently low (14 ng/mL in 2020, 19 ng/mL  in 2021) with one value in normal range (34 ng/mL in 2022). Key Objective Data: Current 25-OH vitamin D: 21 ng/mL (L) Normal calcium levels: 10.1 mg/dL No symptomatic hypocalcemia Treatment: Ergocalciferol 50,000 IU weekly for 12 weeks prescribed Discussed importance of calcium intake with supplementation Sun exposure and dietary sources reviewed Plan: Recheck vitamin D level after completion of high-dose therapy Transition to maintenance therapy after repletion Lab Results  Component Value Date   VD25OH 21 (L) 07/26/2021   VD25OH 34 10/15/2020   VD25OH 19 (L) 07/23/2019   VD25OH 14 (L) 03/21/2018   Lab Results  Component Value Date   TSH 0.98 07/26/2021   ALKPHOS 101 07/22/2010   CALCIUM 10.1 07/26/2021   Lab Results  Component Value Date   CALCIUM 10.1 07/26/2021   CALCIUM 9.9 02/17/2021   CALCIUM 9.5 10/15/2020   CALCIUM 9.8 02/05/2020   CALCIUM 9.6 07/23/2019      Obesity   Current Status: Class 1 obesity with BMI 30.95 kg/m. Significant improvement noted with weight reduction from 246 lbs (February 2022) to current 228 lbs, reflecting approximately 18-pound weight loss over past three years. Key Objective Data: Current weight: 228 lbs BMI: 30.95 kg/m Weight trend: Consistent downward trend from maximum of 246 lbs No weight-related mobility limitations Treatment: Continued dietary modifications with focus on protein and vegetables Regular physical activity recommended Positive reinforcement for successful weight loss Plan: Target additional 10-15 pound weight loss over next six months Reassess at follow-up visits  Lab Results  Component Value Date   TSH 0.98 07/26/2021   Lab Results  Component Value Date   HGBA1C 5.1 07/26/2021   Wt Readings from Last 10 Encounters:  03/23/23 228 lb 3.2 oz (103.5 kg)  07/26/21 238 lb 12.8 oz (108.3 kg)  03/23/21 245 lb (111.1 kg)  02/17/21 241 lb 12.8 oz (109.7 kg)   10/15/20 243 lb (110.2 kg)  02/05/20 246 lb (111.6 kg)  07/23/19 240 lb 6.4 oz (109 kg)  03/21/18 234 lb (106.1 kg)  02/20/18 235 lb 3.2 oz (106.7 kg)  09/10/17 234 lb 9.6 oz (106.4 kg)  Body mass index is 30.95 kg/m.      Medications updated/reviewed: No current outpatient medications on file prior to visit.   No current facility-administered medications on file prior to visit.   Medications Discontinued During This Encounter  Medication Reason   FLUoxetine (PROZAC) 20 MG capsule Dose change   azithromycin (ZITHROMAX) 250 MG tablet Completed Course   dexamethasone (DECADRON) 1 MG tablet Completed Course   promethazine-dextromethorphan (PROMETHAZINE-DM) 6.25-15 MG/5ML syrup Completed Course   busPIRone (BUSPAR) 5 MG tablet Reorder   sucralfate (CARAFATE) 1 g tablet Reorder   omeprazole (PRILOSEC) 40 MG capsule Reorder   bisoprolol-hydrochlorothiazide (ZIAC) 5-6.25 MG tablet Reorder   rosuvastatin (CRESTOR) 5 MG tablet Reorder   famotidine (PEPCID) 40 MG tablet Reorder   buPROPion (WELLBUTRIN XL) 300 MG 24 hr tablet Reorder   Current Meds  Medication Sig   FLUoxetine (PROZAC) 40 MG capsule Take 1 capsule (40 mg total) by mouth daily.   Vitamin D, Ergocalciferol, (DRISDOL) 1.25 MG (50000 UNIT) CAPS capsule Take 1 capsule (50,000 Units total) by mouth every 7 (seven) days.   [DISCONTINUED] bisoprolol-hydrochlorothiazide (ZIAC) 5-6.25 MG tablet TAKE ONE TABLET BY MOUTH DAILY FOR BLOOD PRESSURE   [DISCONTINUED] buPROPion (WELLBUTRIN XL) 300 MG 24 hr tablet Take 1 tablet (300 mg total) by mouth every morning.   [DISCONTINUED] busPIRone (BUSPAR) 5 MG tablet TAKE ONE TABLET BY MOUTH THREE TIMES A DAY (  Patient taking differently: Take 5 mg by mouth 3 (three) times daily. As needed.)   [DISCONTINUED] famotidine (PEPCID) 40 MG tablet Take  1 table t at Bedtime to Prevent Heartburn & Acid Indigestion                                 /                              TAKE                       BY MOUTH   [DISCONTINUED] FLUoxetine (PROZAC) 20 MG capsule Take  1 capsule  Daily  for Mood                                                /                                   TAKE                                     BY                            MOUTH   [DISCONTINUED] omeprazole (PRILOSEC) 40 MG capsule TAKE 1 CAPSULE BY MOUTH DAILY TO PREVENT HEARTBURN AND INDIGESTION   [DISCONTINUED] rosuvastatin (CRESTOR) 5 MG tablet Take 1 tablet (5 mg total) by mouth daily.   [DISCONTINUED] sucralfate (CARAFATE) 1 g tablet TAKE 1 TABLET BY MOUTH EVERY NIGHT AT BEDTIME    Allergies:  No Known Allergies Past Medical History:  has a past medical history of Allergies, Anxiety, Depression (2008), GERD (gastroesophageal reflux disease), HTN (hypertension), Hyperlipidemia, Mixed hyperlipidemia, Sleep apnea (2024), and Unspecified vitamin D deficiency. Past Surgical History:   has a past surgical history that includes Hernia repair (age 32) and Cholecystectomy (2008). Social History:   reports that he quit smoking about 20 years ago. His smoking use included cigarettes. He has a 1.5 pack-year smoking history. He has never used smokeless tobacco. He reports current alcohol use. He reports that he does not use drugs. Family History:  family history includes ADD / ADHD in his daughter and son; Alcohol abuse in his father; Anxiety disorder in his daughter, mother, sister, and son; Arthritis in his father and mother; Asthma in his daughter and son; Cancer in his paternal grandmother; Depression in his daughter, father, mother, sister, and son; Diabetes in his paternal grandfather; Hypertension in his father; Miscarriages / [place] in his mother; Varicose Veins in his mother. Depression Screen and Health Maintenance:    03/23/2023   10:11 AM 07/22/2019   10:53 PM 03/24/2018    4:07 PM 09/10/2017    2:34 PM  PHQ 2/9 Scores  PHQ - 2 Score 0 0 0 0   Health Maintenance  Topic Date Due   DTaP/Tdap/Td (7 - Td or Tdap)  06/22/2020   INFLUENZA VACCINE  04/02/2023 (Originally 08/03/2022)   Hepatitis C Screening  03/22/2024 (Originally 02/05/2001)   HIV Screening  03/22/2024 (  Originally 02/05/1998)   Pneumococcal Vaccine 23-45 Years old  Aged Out   HPV VACCINES  Aged Out   COVID-19 Vaccine  Discontinued   Immunization History  Administered Date(s) Administered   Dtap, Unspecified 04/04/1983, 05/02/1983, 05/30/1983, 08/05/1984, 09/04/1986   Hep B, Unspecified 10/17/1994, 11/22/1994, 04/24/1995   Influenza Inj Mdck Quad Pf 11/15/2016   Influenza-Unspecified 10/02/2017   Measles 10/04/1988   Mumps 08/05/1984   PFIZER(Purple Top)SARS-COV-2 Vaccination 11/06/2019, 11/28/2019   PPD Test 03/21/2018, 07/23/2019   Pneumococcal Polysaccharide-23 06/23/2010   Polio, Unspecified 06/27/1983, 08/30/1983, 08/05/1984, 10/04/1988   Rubella 04/03/1985   Td (Adult),unspecified 11/19/1998   Tdap 06/23/2010     Objective   Physical ExamBP 128/85   Pulse 73   Temp 98.3 F (36.8 C) (Temporal)   Ht 6' (1.829 m)   Wt 228 lb 3.2 oz (103.5 kg)   SpO2 97%   BMI 30.95 kg/m  Wt Readings from Last 10 Encounters:  03/23/23 228 lb 3.2 oz (103.5 kg)  07/26/21 238 lb 12.8 oz (108.3 kg)  03/23/21 245 lb (111.1 kg)  02/17/21 241 lb 12.8 oz (109.7 kg)  10/15/20 243 lb (110.2 kg)  02/05/20 246 lb (111.6 kg)  07/23/19 240 lb 6.4 oz (109 kg)  03/21/18 234 lb (106.1 kg)  02/20/18 235 lb 3.2 oz (106.7 kg)  09/10/17 234 lb 9.6 oz (106.4 kg)  Vital signs reviewed.  Nursing notes reviewed. Weight trend reviewed. General Appearance:  Well developed, well nourished, well-groomed, healthy-appearing male with Body mass index is 30.95 kg/m. No acute distress appreciable.   Skin: Clear and well-hydrated. Pulmonary:  Normal work of breathing at rest, no respiratory distress apparent. SpO2: 97 %  Musculoskeletal: He demonstrates smooth and coordinated movements throughout all major joints.All extremities are intact.  Neurological:  Awake,  alert, oriented, and engaged.  No obvious focal neurological deficits or cognitive impairments.  Sensorium seems unclouded.  Psychiatric:  Appropriate mood, pleasant and cooperative demeanor, cheerful and engaged during the exam  Reviewed Results & Data Results LABS LFTs: elevated Triglycerides: elevated Testosterone: borderline low Vitamin D: deficient on three out of four checks, insufficient on one B12: low    No results found for any visits on 03/23/23.  No visits with results within 1 Year(s) from this visit.  Latest known visit with results is:  Office Visit on 07/26/2021  Component Date Value   WBC 07/26/2021 6.5    RBC 07/26/2021 4.72    Hemoglobin 07/26/2021 15.2    HCT 07/26/2021 44.2    MCV 07/26/2021 93.6    MCH 07/26/2021 32.2    MCHC 07/26/2021 34.4    RDW 07/26/2021 12.7    Platelets 07/26/2021 270    MPV 07/26/2021 12.1    Neutro Abs 07/26/2021 3,692    Lymphs Abs 07/26/2021 2,054    Absolute Monocytes 07/26/2021 657    Eosinophils Absolute 07/26/2021 59    Basophils Absolute 07/26/2021 39    Neutrophils Relative % 07/26/2021 56.8    Total Lymphocyte 07/26/2021 31.6    Monocytes Relative 07/26/2021 10.1    Eosinophils Relative 07/26/2021 0.9    Basophils Relative 07/26/2021 0.6    Glucose, Bld 07/26/2021 78    BUN 07/26/2021 10    Creat 07/26/2021 1.19    eGFR 07/26/2021 80    BUN/Creatinine Ratio 07/26/2021 NOT APPLICABLE    Sodium 07/26/2021 142    Potassium 07/26/2021 4.5    Chloride 07/26/2021 103    CO2 07/26/2021 30    Calcium 07/26/2021 10.1  Total Protein 07/26/2021 7.3    Albumin 07/26/2021 4.7    Globulin 07/26/2021 2.6    AG Ratio 07/26/2021 1.8    Total Bilirubin 07/26/2021 0.8    Alkaline phosphatase (AP* 07/26/2021 102    AST 07/26/2021 30    ALT 07/26/2021 48 (H)    Magnesium 07/26/2021 2.4    Cholesterol 07/26/2021 183    HDL 07/26/2021 37 (L)    Triglycerides 07/26/2021 393 (H)    LDL Cholesterol (Calc) 07/26/2021 94     Total CHOL/HDL Ratio 07/26/2021 4.9    Non-HDL Cholesterol (Cal* 07/26/2021 146 (H)    TSH 07/26/2021 0.98    Hgb A1c MFr Bld 07/26/2021 5.1    Mean Plasma Glucose 07/26/2021 100    eAG (mmol/L) 07/26/2021 5.5    Vit D, 25-Hydroxy 07/26/2021 21 (L)    Color, Urine 07/26/2021 YELLOW    APPearance 07/26/2021 CLEAR    Specific Gravity, Urine 07/26/2021 1.011    pH 07/26/2021 5.5    Glucose, UA 07/26/2021 NEGATIVE    Bilirubin Urine 07/26/2021 NEGATIVE    Ketones, ur 07/26/2021 NEGATIVE    Hgb urine dipstick 07/26/2021 NEGATIVE    Protein, ur 07/26/2021 NEGATIVE    Nitrite 07/26/2021 NEGATIVE    Leukocytes,Ua 07/26/2021 NEGATIVE    Creatinine, Urine 07/26/2021 88    Microalb, Ur 07/26/2021 <0.2    Microalb Creat Ratio 07/26/2021 NOTE    No image results found.   No results found.  No results found.      Assessment & Plan Hyperlipidemia, mixed Assessment: Laboratory findings show mixed hyperlipidemia with elevated triglycerides (393 mg/dL), low HDL (37 mg/dL), and calculated LDL of 94 mg/dL. Total cholesterol to HDL ratio is elevated at 4.9. Currently on rosuvastatin 5mg  daily. Diet described as standard American diet with regular carbohydrate intake. Elevated triglycerides may be contributing to fatty liver and elevated liver enzymes. Plan: Continue rosuvastatin 5mg  daily Emphasized dietary modifications: reduce refined carbohydrates and increase omega-3 fatty acids Discussed relationship between carbohydrate intake and triglyceride levels Recommended Mediterranean diet pattern with emphasis on olive oil, fish, nuts, and vegetables Goal to reduce triglycerides to <150 mg/dL and increase HDL >16 mg/dL Repeat lipid panel in three months to assess response Will consider adding specific triglyceride-lowering therapy if levels remain elevated Labile hypertension Assessment: History of labile hypertension with home monitoring. Recent readings 134/93 and 128/85, indicating borderline  control. Currently on bisoprolol-hydrochlorothiazide 5-6.25mg  daily. No symptoms of end-organ damage. Blood pressure may be affected by concurrent GERD, sleep apnea, and stress/anxiety. Plan: Continue bisoprolol-hydrochlorothiazide 5-6.25mg  daily Encourage continued home blood pressure monitoring, recording in log Recommended lifestyle modifications including sodium restriction and regular exercise Address contributing factors: optimize GERD management and evaluate for sleep apnea Follow up in three months to reassess blood pressure control Target blood pressure <130/80 Class 2 severe obesity due to excess calories with serious comorbidity in adult, unspecified BMI (HCC) Assessment: Current weight 228 lbs with BMI 30.95 kg/m, meeting criteria for Class 1 obesity. Noted significant improvement with weight trend showing approximately 18-pound reduction since February 2022 (246 lbs). Obesity contributing to metabolic issues, hypertension, GERD, and potentially sleep apnea. Plan: Congratulated patient on weight loss progress Discussed continued dietary modifications with emphasis on protein and vegetable intake Encouraged consistent physical activity with goal of 150 minutes per week Set target of additional 10-15 pound weight loss over next six months Discussed potential metabolic benefits of further weight reduction Will reassess at next visit Testosterone deficiency Assessment: Prior testosterone levels borderline low (323 ng/dL  in 2021, 254 ng/dL in 3086). Not currently on testosterone replacement therapy. May contribute to fatigue, mood issues, and metabolic parameters. Plan: Will recheck morning total testosterone along with SHBG, free testosterone Discussed lifestyle factors that may improve testosterone levels naturally Will consider referral to endocrinology if levels remain low and symptoms persist Deferred testosterone replacement pending updated levels Vitamin B12  deficiency  Vitamin D deficiency Assessment: Documented vitamin D deficiency with most recent level of 21 ng/mL (deficient). Prior levels have been consistently low. Not currently taking supplements. Deficiency may contribute to fatigue and long-term bone health risks. Plan: Prescribed ergocalciferol 50,000 IU weekly for 12 weeks Discussed importance of concurrent calcium intake for absorption Recommended vitamin D-rich foods and regular sun exposure Will recheck vitamin D level after completion of high-dose therapy Transition to maintenance therapy after repletion Stress  Anxiety Anxiety and depression are managed with Wellbutrin and fluoxetine. He reports increased irritability and edginess. Previously, he used buspirone as needed. We discussed increasing the fluoxetine dosage and adding daily buspirone. The importance of gradual tapering to avoid withdrawal symptoms was explained. Near 100% improvement is anticipated with the adjusted regimen. Increase fluoxetine to 40 mg daily and prescribe buspirone for daily use. Refill Wellbutrin for one year. Follow-up in one month to assess anxiety management. Depression, unspecified depression type Assessment: Patient with history of anxiety and depression since 2008, currently managed with bupropion 300mg  daily and fluoxetine 20mg  daily. He reports feeling "quicker to snap" and "a little edgy" recently, suggesting suboptimal control. Previously used buspirone as needed. Denies erectile dysfunction from fluoxetine. Stress primarily related to managing work and family responsibilities with three children. PHQ-2 scores have been 0 across multiple screenings, suggesting his symptoms may present more as irritability than traditional depression. Plan: Increase fluoxetine to 40mg  daily to better manage increasing anxiety/depression symptoms Add buspirone 5mg  three times daily for additional anxiety control Continue bupropion XL 300mg  daily Discussed  importance of gradual medication adjustments to avoid withdrawal symptoms Anticipate significant improvement with adjusted regimen Follow up in one month to assess response to medication changes Refilled all psychiatric medications for one year with the caveat of scheduling appointments for further refills Essential hypertension Assessment: History of labile hypertension with home monitoring. Recent readings 134/93 and 128/85, indicating borderline control. Currently on bisoprolol-hydrochlorothiazide 5-6.25mg  daily. No symptoms of end-organ damage. Blood pressure may be affected by concurrent GERD, sleep apnea, and stress/anxiety. Plan: Continue bisoprolol-hydrochlorothiazide 5-6.25mg  daily Encourage continued home blood pressure monitoring, recording in log Recommended lifestyle modifications including sodium restriction and regular exercise Address contributing factors: optimize GERD management and evaluate for sleep apnea Follow up in three months to reassess blood pressure control Target blood pressure <130/80 Gastroesophageal reflux disease, unspecified whether esophagitis present Assessment: Chronic GERD with LPR symptoms, notably nocturnal acid reflux despite current management with omeprazole in the morning and famotidine at night. Previously used sucralfate for nighttime control. Reports waking up with acid in throat. This suggests inadequate control with current regimen. Symptoms significantly impact sleep quality, which may compound anxiety/depression symptoms. Plan: Continue omeprazole 40mg  daily in the morning Continue famotidine 40mg  at bedtime Restart sucralfate 1g at bedtime for additional protection against nocturnal reflux Order Helicobacter pylori stool antigen test to rule out H. 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 We discussed a potential GI referral if symptoms persist and advised Pepcid Complete at bedtime. Addressing underlying causes is crucial to prevent complications like ulcers, malnutrition, and cancer. Helicobacter pylori testing was discussed, with a possible GI referral if no improvement occurs. Order Helicobacter pylori stool antigen test and prescribe Pepcid Complete at bedtime. Refill omeprazole and famotidine for one year and prescribe sucralfate for symptomatic relief. Follow-up in one month to review test results and symptom management. Elevated LFTs Assessment: Persistent mild elevation of liver enzymes with most recent ALT of 48 U/L (elevated) and AST 30 U/L (normal). Previous ALT values have fluctuated between normal and elevated. 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 Plan: Recommended weight loss and reduction in refined carbohydrates Emphasized importance of reducing triglyceride levels Avoid alcohol and hepatotoxic medications Monitor liver enzymes with follow-up labs in three months Consider ultrasound evaluation if enzymes continue to rise OSA (obstructive sleep apnea) Assessment: Suspected obstructive sleep apnea based on spouse's observations of snoring and gasping during sleep. Patient using over-the-counter mouthpiece with some improvement in snoring. Untreated sleep apnea may contribute to hypertension, metabolic issues, and fatigue. Plan: Referred to sleep medicine for formal evaluation and home sleep study Discussed treatment options including CPAP, dental appliances, and  surgical approaches Emphasized importance of diagnosis and treatment for cardiovascular and metabolic health Recommended continued use of mouthpiece until formal evaluation completed Weight loss may help reduce severity of sleep-disordered breathing  Health Maintenance Assessment: Due for influenza vaccine. Tdap vaccination last received in 2012. COVID-19 vaccination completed with primary series in 2021. Age-appropriate screenings current. Plan: Recommended seasonal influenza vaccine Tdap vaccination due, offered today Discussed updated COVID-19 vaccination Scheduled for annual physical with comprehensive preventive services in three months  Diagnoses and all orders for this visit: Hyperlipidemia, mixed -     rosuvastatin (CRESTOR) 5 MG tablet; Take 1 tablet (5 mg total) by mouth daily. Labile hypertension Class 2 severe obesity due to excess calories with serious comorbidity in adult, unspecified BMI (HCC) Testosterone deficiency Vitamin B12 deficiency Vitamin D deficiency -     Vitamin D, Ergocalciferol, (DRISDOL) 1.25 MG (50000 UNIT) CAPS capsule; Take 1 capsule (50,000 Units total) by mouth every 7 (seven) days. Stress Anxiety -     busPIRone (BUSPAR) 5 MG tablet; Take 1 tablet (5 mg total) by mouth 3 (three) times daily. Depression, unspecified depression type -     FLUoxetine (PROZAC) 40 MG capsule; Take 1 capsule (40 mg total) by mouth daily. -     buPROPion (WELLBUTRIN XL) 300 MG 24 hr tablet; Take 1 tablet (300 mg total) by mouth every morning. Essential hypertension -     bisoprolol-hydrochlorothiazide (ZIAC) 5-6.25 MG tablet; TAKE ONE TABLET BY MOUTH DAILY FOR BLOOD PRESSURE Gastroesophageal reflux disease, unspecified whether esophagitis present -     sucralfate (CARAFATE) 1 g tablet; Take 1 tablet (1 g total) by mouth at bedtime. -     Helicobacter Pylori Special Antigen, Stool Laryngopharyngeal reflux (LPR) Gastroesophageal reflux disease -     omeprazole (PRILOSEC)  40 MG capsule; TAKE 1 CAPSULE BY MOUTH DAILY TO PREVENT HEARTBURN AND INDIGESTION -     famotidine (PEPCID) 40 MG tablet; Take  1 table t at Bedtime to Prevent Heartburn & Acid Indigestion                                 /  TAKE                      BY MOUTH Elevated LFTs OSA (obstructive sleep apnea) -     Ambulatory referral to Sleep Studies   Recommended follow up: No follow-ups on file. Future Appointments  Date Time Provider Department Center  05/04/2023 10:00 AM Lula Olszewski, MD [facility]         Additional notes: This document was synthesized by artificial intelligence ([employer]) using HIPAA-compliant recording of the clinical interaction;   We discussed the use of AI scribe software for clinical note transcription with the patient, who gave verbal consent to proceed.    Additional Info: This encounter employed state-of-the-art, real-time, collaborative documentation. The patient actively reviewed and assisted in updating their electronic medical record on a shared screen, ensuring transparency and facilitating joint problem-solving for the problem list, overview, and plan. This approach promotes accurate, informed care. The treatment plan was discussed and reviewed in detail, including medication safety, potential side effects, and all patient questions. We confirmed understanding and comfort with the plan. Follow-up instructions were established, including contacting the office for any concerns, returning if symptoms worsen, persist, or new symptoms develop, and precautions for potential emergency department visits.  Initial Appointment Goals:  This initial visit focused on establishing a foundation for the patient's care. We collaboratively reviewed his medical history and medications in detail, updating the chart as shown in the encounter. Given the extensive information, we prioritized addressing his most pressing concerns, which he reported were: New  Patient (Initial Visit)  While the complexity of the patient's medical picture may necessitate further evaluation in subsequent visits, we were able to develop a preliminary care plan together. To expedite a comprehensive plan at the next visit, we encouraged the patient to gather relevant medical records from previous providers. This collaborative approach will ensure a more complete understanding of the patient's health and inform the development of a personalized care plan. We look forward to continuing the conversation and working together with the patient on achieving his health goals.   Collaborative Documentation:  Today's encounter utilized real-time, dynamic patient engagement.  Patients actively participate by directly reviewing and assisting in updating their medical records through a shared screen. This transparency empowers patients to visually confirm chart updates made by the healthcare provider.  This collaborative approach facilitates problem management as we jointly update the problem list, problem overview, and assessment/plan. Ultimately, this process enhances chart accuracy and completeness, fostering shared decision-making, patient education, and informed consent for tests and treatments.  Collaborative Treatment Planning:  Treatment plans were discussed and reviewed in detail.  Explained medication safety and potential side effects.  Encouraged participation and answered all patient questions, confirming understanding and comfort with the plan. Encouraged patient to contact our office if they have any questions or concerns. Agreed on patient returning to office if symptoms worsen, persist, or new symptoms develop.  ----------------------------------------------------- Lula Olszewski, MD  03/23/2023 10:40 PM  [facility]:  [phone]

## 2023-03-23 NOTE — Assessment & Plan Note (Signed)
 Assessment: Documented vitamin D deficiency with most recent level of 21 ng/mL (deficient). Prior levels have been consistently low. Not currently taking supplements. Deficiency may contribute to fatigue and long-term bone health risks. Plan: Prescribed ergocalciferol 50,000 IU weekly for 12 weeks Discussed importance of concurrent calcium intake for absorption Recommended vitamin D-rich foods and regular sun exposure Will recheck vitamin D level after completion of high-dose therapy Transition to maintenance therapy after repletion

## 2023-03-23 NOTE — Assessment & Plan Note (Signed)
 Assessment: Current weight 228 lbs with BMI 30.95 kg/m, meeting criteria for Class 1 obesity. Noted significant improvement with weight trend showing approximately 18-pound reduction since February 2022 (246 lbs). Obesity contributing to metabolic issues, hypertension, GERD, and potentially sleep apnea. Plan: Congratulated patient on weight loss progress Discussed continued dietary modifications with emphasis on protein and vegetable intake Encouraged consistent physical activity with goal of 150 minutes per week Set target of additional 10-15 pound weight loss over next six months Discussed potential metabolic benefits of further weight reduction Will reassess at next visit

## 2023-05-04 ENCOUNTER — Encounter: Admitting: Internal Medicine

## 2023-05-04 ENCOUNTER — Institutional Professional Consult (permissible substitution): Admitting: Neurology

## 2023-05-25 ENCOUNTER — Ambulatory Visit (INDEPENDENT_AMBULATORY_CARE_PROVIDER_SITE_OTHER): Admitting: Internal Medicine

## 2023-05-25 ENCOUNTER — Encounter: Payer: Self-pay | Admitting: Internal Medicine

## 2023-05-25 VITALS — BP 120/72 | HR 77 | Temp 98.0°F | Ht 72.0 in | Wt 230.8 lb

## 2023-05-25 DIAGNOSIS — E782 Mixed hyperlipidemia: Secondary | ICD-10-CM

## 2023-05-25 DIAGNOSIS — Z0000 Encounter for general adult medical examination without abnormal findings: Secondary | ICD-10-CM | POA: Diagnosis not present

## 2023-05-25 DIAGNOSIS — E66812 Obesity, class 2: Secondary | ICD-10-CM | POA: Diagnosis not present

## 2023-05-25 DIAGNOSIS — R7989 Other specified abnormal findings of blood chemistry: Secondary | ICD-10-CM

## 2023-05-25 DIAGNOSIS — R0989 Other specified symptoms and signs involving the circulatory and respiratory systems: Secondary | ICD-10-CM

## 2023-05-25 DIAGNOSIS — E559 Vitamin D deficiency, unspecified: Secondary | ICD-10-CM

## 2023-05-25 NOTE — Assessment & Plan Note (Signed)
 Will order lab testing to guide management.

## 2023-05-25 NOTE — Assessment & Plan Note (Signed)
 Will order lab testing to guide management., Well-controlled continue(s) with current medication(s)

## 2023-05-25 NOTE — Progress Notes (Signed)
 [facility] [address] [place], [place] [address] Office:  [phone]  -- Annual Preventive Medical Office Visit --  Patient:  Peter Le      Age: 38 y.o.       Sex:  male  Date:   05/25/2023 Patient Care Team: Anthon Kins, MD as PCP - General (Internal Medicine) Kenney Peacemaker, MD as Consulting Physician (Gastroenterology) Today's Healthcare Provider: Anthon Kins, MD  ========================================= Chief complaint: Annual Exam (Pt is present for cpe today)  Purpose of Visit: Comprehensive preventive health assessment and personalized health maintenance planning.  This encounter was conducted as a Comprehensive Physical Exam (CPE) preventive care annual visit. The patient's medical history and problem list were reviewed to inform individualized preventive care recommendations.   No problem-specific medical treatment was provided during this visit.    Assessment & Plan Routine general medical examination at a health care facility See AVS extensive counseling given. Hyperlipidemia, mixed Medications: continue rosuvastatin  5 Will order lab testing to guide management.  Lab Results  Component Value Date   HDL 37 (L) 07/26/2021   HDL 38 (L) 02/17/2021   HDL 36 (L) 10/15/2020   CHOLHDL 4.9 07/26/2021   CHOLHDL 4.1 02/17/2021   CHOLHDL 5.8 (H) 10/15/2020   Lab Results  Component Value Date   LDLCALC 94 07/26/2021   LDLCALC 79 02/17/2021   LDLCALC 136 (H) 10/15/2020   Lab Results  Component Value Date   TRIG 393 (H) 07/26/2021   TRIG 315 (H) 02/17/2021   TRIG 219 (H) 10/15/2020   Lab Results  Component Value Date   CHOL 183 07/26/2021   CHOL 155 02/17/2021   CHOL 208 (H) 10/15/2020   The 10-year ASCVD risk score (Arnett DK, et al., 2019) is: 1.5%   Values used to calculate the score:     Age: 23 years     Sex: Male     Is Non-Hispanic African American: No     Diabetic: No     Tobacco smoker: No      Systolic Blood Pressure: 120 mmHg     Is BP treated: Yes     HDL Cholesterol: 37 mg/dL     Total Cholesterol: 183 mg/dL Lab Results  Component Value Date   ALT 48 (H) 07/26/2021   AST 30 07/26/2021   ALKPHOS 101 07/22/2010   TSH 0.98 07/26/2021   HGBA1C 5.1 07/26/2021   Body mass index is 31.3 kg/m.  Lipoprotein(a), Apolipoprotein B (ApoB), and High-sensitivity C-reactive protein (hs-CRP) No results found for: "HSCRP", "LIPOA" Improving Your Cholesterol: Diet: Focus on a Mediterranean-style diet, limit saturated fats and sugars, and increase omega-3 fatty acids (fish, flaxseeds,nuts,extra virgin olive oil, avocados). Exercise: Engage in regular physical activity (aerobic exercises are particularly beneficial for HDL). Weight Management: Maintain a healthy weight.  Elevated LFTs Will order lab testing to guide management.  Class 2 severe obesity due to excess calories with serious comorbidity in adult, unspecified BMI (HCC) He desires to reduce his waist size from 36 inches to 34 inches. His lifestyle is sedentary, with about 6000 steps per day, and his diet is high in trans fats and junk food, increasing cardiovascular risk. He is aware of these risks and misleading trans fat labeling. Encourage reducing junk food intake, eliminating trans fats, and incorporating more fruits, vegetables, and healthy fats like extra virgin olive oil, avocados, and fish. Recommend increasing physical activity with daily walks and resistance exercises such as pushups or resistance  bands. Discuss the potential benefits of a low-carb diet or weight training program to reduce waist size. Labile hypertension Will order lab testing to guide management., Well-controlled continue(s) with current medication(s)  Vitamin D  deficiency Vitamin D  deficiency is managed with weekly vitamin D  supplementation. He is compliant with the prescribed regimen and daily multivitamins. Continue weekly vitamin D   supplementation. Assessment and Plan Assessment & Plan Obesity    Vitamin D  Deficiency    Recording duration: 30 minutes  There are no diagnoses linked to this encounter.  Reviewed/updated/encouraged completion: Immunization History  Administered Date(s) Administered   Dtap, Unspecified 04/04/1983, 05/02/1983, 05/30/1983, 08/05/1984, 09/04/1986   Hep B, Unspecified 10/17/1994, 11/22/1994, 04/24/1995   Influenza Inj Mdck Quad Pf 11/15/2016   Influenza-Unspecified 10/02/2017   Measles 10/04/1988   Mumps 08/05/1984   PFIZER(Purple Top)SARS-COV-2 Vaccination 11/06/2019, 11/28/2019   PPD Test 03/21/2018, 07/23/2019   Pneumococcal Polysaccharide-23 06/23/2010   Polio, Unspecified 06/27/1983, 08/30/1983, 08/05/1984, 10/04/1988   Rubella 04/03/1985   Td (Adult),unspecified 11/19/1998   Tdap 06/23/2010   Health Maintenance Due  Topic Date Due   DTaP/Tdap/Td (7 - Td or Tdap) 06/22/2020   Health Maintenance  Topic Date Due   DTaP/Tdap/Td (7 - Td or Tdap) 06/22/2020   Hepatitis C Screening  03/22/2024 (Originally 02/05/2001)   HIV Screening  03/22/2024 (Originally 02/05/1998)   INFLUENZA VACCINE  08/03/2023   Pneumococcal Vaccine 28-60 Years old  Aged Out   HPV VACCINES  Aged Out   Meningococcal B Vaccine  Aged Out   COVID-19 Vaccine  Discontinued    Reviewed the following verbally with patient and provided AVS materials:  HEALTH MAINTENANCE COUNSELING AND ANTICIPATORY GUIDANCE   Preventive Measure Recommendation  Eye Exams Every 1-2 years  Dental Care Cleanings every 6 months or more, brush/floss 3x daily  Sinus Care Saline spray rinses daily  Sleep 8 hours nightly, good sleep hygiene, e-monitoring if any daytime drowsiness  Diet Fruits/vegetables/fiber/healthy fats, balance and moderation  Exercise 150 minutes weekly  Risk Behaviors Discouraged any/all high risk behaviors   CANCER SCREENING SHARED DECISION MAKING   Penile/Testicle/Scrotum Encouraged self-monitoring and  reporting of genital abnormalities. Patient reports none.  Thyroid Checked and advised to palpate thyroid for nodules  Prostate Individualized risks/benefits/costs discussed   PSA RESULTS: No results found for: "PSA"      Colon HM Colonoscopy   This patient has no relevant Health Maintenance data.     Lung Current guidelines recommend individuals aged 51 to 22 who currently smoke or formerly smoked and have a >= 20 pack-year smoking history should undergo annual screening with low-dose computed tomography (LDCT). Tobacco Use: Medium Risk (05/25/2023)   Patient History    Smoking Tobacco Use: Former    Smokeless Tobacco Use: Never    Passive Exposure: Not on file    Skin Advised regular sunscreen use. Patient denies worrisome, changing, or new skin lesions. Offered to include images in chart for surveillance. Showed patient these pictures of melanomas for reference to educate for self-monitoring. Photographs Taken 05/25/2023 :     Other Cancers Discussed lack of screening guidelines and insurance coverage for other cancer types.    Discussed the use of AI scribe software for clinical note transcription with the patient, who gave verbal consent to proceed.  History of Present Illness Peter Le is a 38 year old male who presents for an annual physical exam.  He has no current health issues and is focused on maintaining his health and preventing future problems. No chest pain,  palpitations, respiratory issues, or new skin lesions. He reports stable weight and waist circumference, wearing a size 36 waist.  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 His chair is uncomfortable, affecting his posture and contributing to musculoskeletal discomfort.  He leads a generally sedentary lifestyle, with about 6,000 steps per day at work but no structured exercise routine. His  diet includes junk food, though he tries to avoid it.  He has a family history of pancreatic cancer, as his paternal grandmother died from it. He does not smoke and has no significant alcohol use.  Review of Systems  Constitutional:  Negative for chills, diaphoresis, fever, malaise/fatigue and weight loss.  HENT:  Negative for congestion, ear discharge, ear pain, hearing loss, nosebleeds, sinus pain, sore throat and tinnitus.   Eyes:  Negative for blurred vision, double vision, photophobia, pain, discharge and redness.  Respiratory:  Negative for cough, hemoptysis, sputum production, shortness of breath, wheezing and stridor.   Cardiovascular:  Negative for chest pain, palpitations, orthopnea, claudication, leg swelling and PND.  Gastrointestinal:  Negative for abdominal pain, blood in stool, constipation, diarrhea, heartburn, melena, nausea and vomiting.  Genitourinary:  Negative for dysuria, flank pain, frequency, hematuria and urgency.  Musculoskeletal:  Positive for back pain, joint pain (chronic unchanged) and neck pain (chronic unchanged). Negative for falls and myalgias.  Skin:  Negative for itching and rash.  Neurological:  Negative for dizziness, tingling, tremors, sensory change, speech change, focal weakness, seizures, loss of consciousness, weakness and headaches.  Endo/Heme/Allergies:  Negative for environmental allergies and polydipsia. Does not bruise/bleed easily.  Psychiatric/Behavioral:  Negative for depression, hallucinations, memory loss, substance abuse and suicidal ideas. The patient is not nervous/anxious and does not have insomnia.    A comprehensive ROS was negative for any concerning symptoms.   Completed medication reconciliation: Current Outpatient Medications on File Prior to Visit  Medication Sig   bisoprolol -hydrochlorothiazide  (ZIAC ) 5-6.25 MG tablet TAKE ONE TABLET BY MOUTH DAILY FOR BLOOD PRESSURE   buPROPion  (WELLBUTRIN  XL) 300 MG 24 hr tablet Take 1 tablet  (300 mg total) by mouth every morning.   busPIRone  (BUSPAR ) 5 MG tablet Take 1 tablet (5 mg total) by mouth 3 (three) times daily.   famotidine  (PEPCID ) 40 MG tablet Take  1 table t at Bedtime to Prevent Heartburn & Acid Indigestion                                 /                              TAKE                      BY MOUTH   FLUoxetine  (PROZAC ) 40 MG capsule Take 1 capsule (40 mg total) by mouth daily.   omeprazole  (PRILOSEC) 40 MG capsule TAKE 1 CAPSULE BY MOUTH DAILY TO PREVENT HEARTBURN AND INDIGESTION   rosuvastatin  (CRESTOR ) 5 MG tablet Take 1 tablet (5 mg total) by mouth daily.   sucralfate  (CARAFATE ) 1 g tablet Take 1 tablet (1 g total) by mouth at bedtime.   Vitamin D , Ergocalciferol , (DRISDOL ) 1.25 MG (50000 UNIT) CAPS capsule Take 1 capsule (50,000 Units total) by mouth every 7 (seven) days.   No current facility-administered medications on file  prior to visit.  There are no discontinued medications.The following were reviewed and/or entered/updated into our electronic MEDICAL RECORD NUMBERPast Medical History:  Diagnosis Date   Allergies    Anxiety    Depression 2008   GERD (gastroesophageal reflux disease)    HTN (hypertension)    Hyperlipidemia    Mixed hyperlipidemia    Sleep apnea 2024   Not diagnosed   Unspecified vitamin D  deficiency    Past Surgical History:  Procedure Laterality Date   CHOLECYSTECTOMY  2008   gallstones   HERNIA REPAIR  age 89   Social History   Socioeconomic History   Marital status: Married    Spouse name: Not on file   Number of children: 2   Years of education: Not on file   Highest education level: Some college, no degree  Occupational History   Occupation: Advertising copywriter  Tobacco Use   Smoking status: Former    Current packs/day: 0.00    Average packs/day: 0.5 packs/day for 3.0 years (1.5 ttl pk-yrs)    Types: Cigarettes    Quit date: 01/03/2003    Years since quitting: 20.4   Smokeless tobacco: Never  Vaping Use   Vaping  status: Never Used  Substance and Sexual Activity   Alcohol use: Yes    Comment: Rarely drink but on occasion   Drug use: No   Sexual activity: Yes    Birth control/protection: None  Other Topics Concern   Not on file  Social History Narrative   Not on file   Social Drivers of Health   Financial Resource Strain: Low Risk  (03/22/2023)   Overall Financial Resource Strain (CARDIA)    Difficulty of Paying Living Expenses: Not very hard  Food Insecurity: Food Insecurity Present (03/22/2023)   Hunger Vital Sign    Worried About Running Out of Food in the Last Year: Sometimes true    Ran Out of Food in the Last Year: Never true  Transportation Needs: No Transportation Needs (03/22/2023)   PRAPARE - Administrator, Civil Service (Medical): No    Lack of Transportation (Non-Medical): No  Physical Activity: Unknown (03/22/2023)   Exercise Vital Sign    Days of Exercise per Week: 0 days    Minutes of Exercise per Session: Not on file  Stress: Stress Concern Present (03/22/2023)   [employer] of Occupational Health - Occupational Stress Questionnaire    Feeling of Stress : To some extent  Social Connections: Moderately Isolated (03/22/2023)   Social Connection and Isolation Panel [NHANES]    Frequency of Communication with Friends and Family: Once a week    Frequency of Social Gatherings with Friends and Family: Once a week    Attends Religious Services: More than 4 times per year    Active Member of [employer] or Organizations: No    Attends Banker Meetings: Not on file    Marital Status: Married  Intimate Partner Violence: Not on file      03/22/2023    7:29 PM  Alcohol Use Disorder Test (AUDIT)  1. How often do you have a drink containing alcohol? 1  2. How many drinks containing alcohol do you have on a typical day when you are drinking? 0  3. How often do you have six or more drinks on one occasion? 0  AUDIT-C Score 1      Patient-reported   Family  History  Problem Relation Age of Onset   Miscarriages / [place] Mother  Anxiety disorder Mother    Arthritis Mother    Depression Mother    Varicose Veins Mother    Depression Father    Hypertension Father    Alcohol abuse Father    Arthritis Father    Anxiety disorder Sister    Depression Sister    ADD / ADHD Daughter    Anxiety disorder Daughter    Asthma Daughter    Depression Daughter    ADD / ADHD Son    Anxiety disorder Son    Asthma Son    Depression Son    Cancer Paternal Grandmother    Diabetes Paternal Grandfather    Colon cancer Neg Hx   No Known Allergies Social History   Substance and Sexual Activity  Sexual Activity Yes   Birth control/protection: None   Social History   Tobacco Use   Smoking status: Former    Current packs/day: 0.00    Average packs/day: 0.5 packs/day for 3.0 years (1.5 ttl pk-yrs)    Types: Cigarettes    Quit date: 01/03/2003    Years since quitting: 20.4   Smokeless tobacco: Never  Vaping Use   Vaping status: Never Used  Substance Use Topics   Alcohol use: Yes    Comment: Rarely drink but on occasion   Drug use: No      05/25/2023    2:42 PM  Depression screen PHQ 2/9  Decreased Interest 0  Down, Depressed, Hopeless 0  PHQ - 2 Score 0      03/23/2023    9:55 AM  Fall Risk   Falls in the past year? 0  Number falls in past yr: 0  Injury with Fall? 0  Risk for fall due to : No Fall Risks  Follow up Falls evaluation completed     BP 120/72   Pulse 77   Temp 98 F (36.7 C) (Temporal)   Ht 6' (1.829 m)   Wt 230 lb 12.8 oz (104.7 kg)   SpO2 98%   BMI 31.30 kg/m  BP Readings from Last 3 Encounters:  05/25/23 120/72  03/23/23 128/85  07/26/21 110/78   Wt Readings from Last 10 Encounters:  05/25/23 230 lb 12.8 oz (104.7 kg)  03/23/23 228 lb 3.2 oz (103.5 kg)  07/26/21 238 lb 12.8 oz (108.3 kg)  03/23/21 245 lb (111.1 kg)  02/17/21 241 lb 12.8 oz (109.7 kg)  10/15/20 243 lb (110.2 kg)  02/05/20 246 lb  (111.6 kg)  07/23/19 240 lb 6.4 oz (109 kg)  03/21/18 234 lb (106.1 kg)  02/20/18 235 lb 3.2 oz (106.7 kg)   Waist circumference 36" Physical Exam Physical Exam HEENT: Vision 20/20 with correction.  NEUROLOGICAL: Reflexes normal. Gait normal, heel-to-toe intact.  GEN: No acute distress, resting comfortably. HEENT: Tympanic membranes normal appearing bilaterally, oropharynx clear, no thyromegaly noted, no palpable lymphadenopathy or thyroid nodules. CARDIOVASCULAR: S1 and S2 heart sounds with regular rate and rhythm, no murmurs appreciated. PULMONARY: Normal work of breathing, clear to auscultation bilaterally, no crackles, wheezes, or rhonchi. ABDOMEN: Soft, nontender, nondistended. MSK: No edema, cyanosis, or clubbing noted. SKIN: Warm, dry, no lesions of concern observed. Took images for monitoring Photographs Taken 05/25/2023 :    NEUROLOGICAL: Cranial nerves II-XII grossly intact, strength 5/5 in upper and lower extremities, reflexes symmetric and intact bilaterally. PSYCH: Normal affect and thought content, pleasant and cooperative.      ======================================  Notes:  This document was synthesized by artificial intelligence ([employer]bridge) using HIPAA-compliant recording of the clinical interaction;  We discussed the use of AI scribe software for clinical note transcription with the patient, who gave verbal consent to proceed.    This encounter employed state-of-the-art, real-time, collaborative documentation. The patient was empowered to actively review and assist in updating their electronic medical record on a shared monitor, ensuring transparency and improving accuracy.    Prior to and at the beginning of Comprehensive Physical Exam (CPE) preventive care annual visit appointment types  we clarify to patients "Our goal today is to focus on your preventive or annual Comprehensive Physical Exam (CPE) preventive care annual visit, which typically covers routine  screenings and overall health maintenance. However, if you share any new or concerning symptoms--such as dizziness, passing out, severe pain, or anything else that may point to a more serious issue--we are both legally and ethically required to evaluate it. We cannot simply overlook or ignore such concerns, even if you later decide you don't want to discuss them, because it could jeopardize your health.  If addressing a new concern takes us  beyond the scope of the preventive visit, we may need to bill separately for that portion of care. We understand financial considerations are important, and we're happy to discuss your options if something new comes up. However, we want to be clear that once you mention a potentially serious issue, we must investigate it; we can't ethically or legally exclude that from our records or our evaluation. Please let us  know all of your questions or worries. Together, we can decide how best to manage them and how to minimize any unexpected costs, but we want to keep you safe above all else."   This disclosure is mandated by professional ethics and legal obligations, as healthcare providers must address any substantial health concerns raised during any patient interaction and a comprehensive ROS is required by insurance companies for billing preventive-care visit type.   This disclosure ultimately discourages patients financially from reporting significant health issues.

## 2023-05-25 NOTE — Patient Instructions (Addendum)
 IMPORTANT HEALTH REMINDERS: Report any new or changing skin lesions promptly Maintain recommended screening schedules Discuss any new family history of cancer at future visits Follow up on any new symptoms that persist more than two weeks     ?? Trans Fats: What You Need to Know (and How to Avoid Them) Protect Your Heart, Brain, and Overall Health  ? What Are Trans Fats? Trans fats are a type of unhealthy fat that can increase your risk of: Heart disease Stroke Type 2 diabetes Inflammation Memory problems They are artificially made through a process called hydrogenation and were once common in processed foods for better shelf life and texture.  ?? Why Should I Avoid Trans Fats? Even small amounts of trans fats can: Raise "bad" LDL cholesterol Lower "good" HDL cholesterol Cause inflammation in your blood vessels Increase your risk of heart attack or stroke There is no safe level of artificial trans fat.  ?? How to Spot Trans Fats (Even When the Label Says "0g") Food companies can legally say "0 grams trans fat" if the product contains less than 0.5 grams per serving -- but that can add up fast! Look at the ingredients list for these clues: ?? Partially hydrogenated oil ? this means trans fat is present. ? Avoid foods with "shortening" or "hydrogenated" oils.  ?? Common Foods That May Contain Trans Fats Even today, you may find trans fats in: Baked goods (cookies, cakes, pies) Microwave popcorn Crackers Margarine and shortening Fried fast foods Frozen pizza  ? Healthier Choices Choose products with 0g trans fat and no "partially hydrogenated oil" in the ingredients. Use olive oil, avocado oil, or canola oil for cooking. Eat more whole, unprocessed foods: fruits, vegetables, whole grains, and lean proteins. Choose baked over fried, and fresh over packaged.  ?? Takeaway Message Trans fats are harmful, even in small amounts. To protect your health: Read labels  carefully. Look beyond "0g trans fat" and scan for "partially hydrogenated oils." Choose whole foods and heart-healthy fats.

## 2023-05-25 NOTE — Assessment & Plan Note (Signed)
 Vitamin D  deficiency is managed with weekly vitamin D  supplementation. He is compliant with the prescribed regimen and daily multivitamins. Continue weekly vitamin D  supplementation.

## 2023-05-25 NOTE — Assessment & Plan Note (Signed)
 Medications: continue rosuvastatin  5 Will order lab testing to guide management.  Lab Results  Component Value Date   HDL 37 (L) 07/26/2021   HDL 38 (L) 02/17/2021   HDL 36 (L) 10/15/2020   CHOLHDL 4.9 07/26/2021   CHOLHDL 4.1 02/17/2021   CHOLHDL 5.8 (H) 10/15/2020   Lab Results  Component Value Date   LDLCALC 94 07/26/2021   LDLCALC 79 02/17/2021   LDLCALC 136 (H) 10/15/2020   Lab Results  Component Value Date   TRIG 393 (H) 07/26/2021   TRIG 315 (H) 02/17/2021   TRIG 219 (H) 10/15/2020   Lab Results  Component Value Date   CHOL 183 07/26/2021   CHOL 155 02/17/2021   CHOL 208 (H) 10/15/2020   The 10-year ASCVD risk score (Arnett DK, et al., 2019) is: 1.5%   Values used to calculate the score:     Age: 38 years     Sex: Male     Is Non-Hispanic African American: No     Diabetic: No     Tobacco smoker: No     Systolic Blood Pressure: 120 mmHg     Is BP treated: Yes     HDL Cholesterol: 37 mg/dL     Total Cholesterol: 183 mg/dL Lab Results  Component Value Date   ALT 48 (H) 07/26/2021   AST 30 07/26/2021   ALKPHOS 101 07/22/2010   TSH 0.98 07/26/2021   HGBA1C 5.1 07/26/2021   Body mass index is 31.3 kg/m.  Lipoprotein(a), Apolipoprotein B (ApoB), and High-sensitivity C-reactive protein (hs-CRP) No results found for: "HSCRP", "LIPOA" Improving Your Cholesterol: Diet: Focus on a Mediterranean-style diet, limit saturated fats and sugars, and increase omega-3 fatty acids (fish, flaxseeds,nuts,extra virgin olive oil, avocados). Exercise: Engage in regular physical activity (aerobic exercises are particularly beneficial for HDL). Weight Management: Maintain a healthy weight.

## 2023-05-25 NOTE — Assessment & Plan Note (Signed)
 He desires to reduce his waist size from 36 inches to 34 inches. His lifestyle is sedentary, with about 6000 steps per day, and his diet is high in trans fats and junk food, increasing cardiovascular risk. He is aware of these risks and misleading trans fat labeling. Encourage reducing junk food intake, eliminating trans fats, and incorporating more fruits, vegetables, and healthy fats like extra virgin olive oil, avocados, and fish. Recommend increasing physical activity with daily walks and resistance exercises such as pushups or resistance bands. Discuss the potential benefits of a low-carb diet or weight training program to reduce waist size.

## 2023-05-26 ENCOUNTER — Ambulatory Visit: Payer: Self-pay | Admitting: Internal Medicine

## 2023-05-26 LAB — COMPREHENSIVE METABOLIC PANEL WITH GFR
AG Ratio: 1.6 (calc) (ref 1.0–2.5)
ALT: 26 U/L (ref 9–46)
AST: 23 U/L (ref 10–40)
Albumin: 4.4 g/dL (ref 3.6–5.1)
Alkaline phosphatase (APISO): 79 U/L (ref 36–130)
BUN: 9 mg/dL (ref 7–25)
CO2: 26 mmol/L (ref 20–32)
Calcium: 9.7 mg/dL (ref 8.6–10.3)
Chloride: 103 mmol/L (ref 98–110)
Creat: 1.11 mg/dL (ref 0.60–1.29)
Globulin: 2.7 g/dL (ref 1.9–3.7)
Glucose, Bld: 87 mg/dL (ref 65–99)
Potassium: 4 mmol/L (ref 3.5–5.3)
Sodium: 139 mmol/L (ref 135–146)
Total Bilirubin: 0.8 mg/dL (ref 0.2–1.2)
Total Protein: 7.1 g/dL (ref 6.1–8.1)
eGFR: 86 mL/min/{1.73_m2} (ref >=60)

## 2023-05-26 LAB — CBC WITH DIFFERENTIAL/PLATELET
Absolute Lymphocytes: 1843 {cells}/uL (ref 850–3900)
Absolute Monocytes: 634 {cells}/uL (ref 200–950)
Basophils Absolute: 32 {cells}/uL (ref 0–200)
Basophils Relative: 0.5 %
Eosinophils Absolute: 38 {cells}/uL (ref 15–500)
Eosinophils Relative: 0.6 %
HCT: 42.9 % (ref 38.5–50.0)
Hemoglobin: 14.3 g/dL (ref 13.2–17.1)
MCH: 30.9 pg (ref 27.0–33.0)
MCHC: 33.3 g/dL (ref 32.0–36.0)
MCV: 92.7 fL (ref 80.0–100.0)
MPV: 12.7 fL — ABNORMAL HIGH (ref 7.5–12.5)
Monocytes Relative: 9.9 %
Neutro Abs: 3853 {cells}/uL (ref 1500–7800)
Neutrophils Relative %: 60.2 %
Platelets: 238 10*3/uL (ref 140–400)
RBC: 4.63 10*6/uL (ref 4.20–5.80)
RDW: 12.8 % (ref 11.0–15.0)
Total Lymphocyte: 28.8 %
WBC: 6.4 10*3/uL (ref 3.8–10.8)

## 2023-05-26 LAB — LIPID PANEL W/REFLEX DIRECT LDL
Cholesterol: 146 mg/dL (ref <200)
HDL: 43 mg/dL (ref >=40)
LDL Cholesterol (Calc): 71 mg/dL
Non-HDL Cholesterol (Calc): 103 mg/dL (ref <130)
Total CHOL/HDL Ratio: 3.4 (calc) (ref <5.0)
Triglycerides: 220 mg/dL — ABNORMAL HIGH (ref <150)

## 2023-05-26 LAB — TSH RFX ON ABNORMAL TO FREE T4: TSH: 0.902 u[IU]/mL (ref 0.450–4.500)

## 2023-05-29 ENCOUNTER — Other Ambulatory Visit: Payer: Self-pay | Admitting: Internal Medicine

## 2023-05-29 DIAGNOSIS — F32A Depression, unspecified: Secondary | ICD-10-CM

## 2023-07-09 ENCOUNTER — Other Ambulatory Visit: Payer: Self-pay | Admitting: Internal Medicine

## 2023-07-09 DIAGNOSIS — F32A Depression, unspecified: Secondary | ICD-10-CM

## 2023-08-20 ENCOUNTER — Encounter: Payer: 59 | Admitting: Nurse Practitioner

## 2023-08-23 ENCOUNTER — Other Ambulatory Visit: Payer: Self-pay | Admitting: Internal Medicine

## 2023-08-23 DIAGNOSIS — F32A Depression, unspecified: Secondary | ICD-10-CM

## 2023-09-21 ENCOUNTER — Other Ambulatory Visit: Payer: Self-pay | Admitting: Internal Medicine

## 2023-09-21 DIAGNOSIS — F32A Depression, unspecified: Secondary | ICD-10-CM

## 2023-09-21 DIAGNOSIS — I10 Essential (primary) hypertension: Secondary | ICD-10-CM

## 2023-10-19 ENCOUNTER — Other Ambulatory Visit: Payer: Self-pay | Admitting: Internal Medicine

## 2023-10-19 DIAGNOSIS — F32A Depression, unspecified: Secondary | ICD-10-CM

## 2023-11-21 ENCOUNTER — Other Ambulatory Visit: Payer: Self-pay | Admitting: Internal Medicine

## 2023-11-21 DIAGNOSIS — F32A Depression, unspecified: Secondary | ICD-10-CM

## 2023-12-18 ENCOUNTER — Other Ambulatory Visit: Payer: Self-pay | Admitting: Internal Medicine

## 2023-12-18 DIAGNOSIS — F32A Depression, unspecified: Secondary | ICD-10-CM

## 2023-12-25 ENCOUNTER — Ambulatory Visit: Payer: Self-pay

## 2023-12-25 DIAGNOSIS — S335XXA Sprain of ligaments of lumbar spine, initial encounter: Secondary | ICD-10-CM | POA: Diagnosis not present

## 2023-12-25 DIAGNOSIS — M9901 Segmental and somatic dysfunction of cervical region: Secondary | ICD-10-CM | POA: Diagnosis not present

## 2023-12-25 DIAGNOSIS — M5412 Radiculopathy, cervical region: Secondary | ICD-10-CM | POA: Diagnosis not present

## 2023-12-25 DIAGNOSIS — M62830 Muscle spasm of back: Secondary | ICD-10-CM | POA: Diagnosis not present

## 2023-12-25 NOTE — Telephone Encounter (Signed)
 FYI Only or Action Required?: FYI only for provider: appointment scheduled on 12/26/23.  Patient was last seen in primary care on 05/25/2023 by Jesus Bernardino MATSU, MD.  Called Nurse Triage reporting Facial Pain, Sore Throat (r), and Fatigue.  Symptoms began a week ago.  Interventions attempted: OTC medications: Sudafed.  Symptoms are: gradually worsening.  Triage Disposition: See PCP When Office is Open (Within 3 Days)  Patient/caregiver understands and will follow disposition?: Yes  Reason for Disposition  [1] Using nasal washes and pain medicine > 24 hours AND [2] sinus pain (around cheekbone or eye) persists  Answer Assessment - Initial Assessment Questions 1. LOCATION: Where does it hurt?      Sinus pressure/burning in forehead down to below eyes  2. ONSET: When did the sinus pain start?  (e.g., hours, days)      About a week ago  3. SEVERITY: How bad is the pain?   (Scale 0-10; or none, mild, moderate or severe)     Moderate  4. RECURRENT SYMPTOM: Have you ever had sinus problems before? If Yes, ask: When was the last time? and What happened that time?      Yes, not sure of last time  5. NASAL CONGESTION: Is the nose blocked? If Yes, ask: Can you open it or must you breathe through your mouth?     Able to clear nose  6. NASAL DISCHARGE: Do you have discharge from your nose? If so ask, What color?     Yes  7. FEVER: Do you have a fever? If Yes, ask: What is it, how was it measured, and when did it start?      No  8. OTHER SYMPTOMS: Do you have any other symptoms? (e.g., sore throat, cough, earache, difficulty breathing)     Sore throat, fatigue, cough  9. PREGNANCY: Is there any chance you are pregnant? When was your last menstrual period?     NA  Protocols used: Sinus Pain or Congestion-A-AH  Copied from CRM #[id number]. Topic: Clinical - Red Word Triage >> Dec 25, 2023 10:36 AM Mesmerise C wrote: [facility] that prompted transfer to Nurse  Triage: Patient has been having sinus drainage and pressure, sore throat, fatigue and getting worse as the days continues

## 2023-12-25 NOTE — Telephone Encounter (Signed)
 Noted. appt scheduled

## 2023-12-26 ENCOUNTER — Ambulatory Visit: Admitting: Internal Medicine

## 2023-12-26 VITALS — BP 128/82 | HR 76 | Temp 98.4°F | Resp 16 | Ht 72.0 in | Wt 223.0 lb

## 2023-12-26 DIAGNOSIS — J0100 Acute maxillary sinusitis, unspecified: Secondary | ICD-10-CM

## 2023-12-26 DIAGNOSIS — H6993 Unspecified Eustachian tube disorder, bilateral: Secondary | ICD-10-CM

## 2023-12-26 DIAGNOSIS — M5412 Radiculopathy, cervical region: Secondary | ICD-10-CM | POA: Diagnosis not present

## 2023-12-26 DIAGNOSIS — M9901 Segmental and somatic dysfunction of cervical region: Secondary | ICD-10-CM | POA: Diagnosis not present

## 2023-12-26 DIAGNOSIS — M62830 Muscle spasm of back: Secondary | ICD-10-CM | POA: Diagnosis not present

## 2023-12-26 DIAGNOSIS — J060 Acute laryngopharyngitis: Secondary | ICD-10-CM | POA: Diagnosis not present

## 2023-12-26 DIAGNOSIS — S335XXA Sprain of ligaments of lumbar spine, initial encounter: Secondary | ICD-10-CM | POA: Diagnosis not present

## 2023-12-26 MED ORDER — AMOXICILLIN-POT CLAVULANATE 875-125 MG PO TABS: 1.0000 | ORAL_TABLET | Freq: Two times a day (BID) | ORAL | 0 refills | Status: AC

## 2023-12-26 MED ORDER — HYDROCODONE BIT-HOMATROP MBR 5-1.5 MG/5ML PO SOLN: 5.0000 mL | Freq: Three times a day (TID) | ORAL | 0 refills | Status: AC | PRN

## 2023-12-26 MED ORDER — METHYLPREDNISOLONE 4 MG PO TBPK: ORAL_TABLET | ORAL | 0 refills | Status: AC

## 2023-12-26 NOTE — Progress Notes (Signed)
 "  Subjective:  Patient ID: Peter Le, male    DOB: 11-Apr-1985  Age: 38 y.o. MRN: [id number]  CC: Sinus Problem (Sinus pressure. Started with drainage in his throat and he has some irritation in his throat. His ears are popping and he has pressure in there as well. This started about a week ago. )   HPI Peter Le presents for f/up ---  Discussed the use of AI scribe software for clinical note transcription with the patient, who gave verbal consent to proceed.  History of Present Illness Peter Le is a 38 year old male who presents with sinus pressure and throat irritation.  He has been experiencing significant sinus pressure, throat irritation, and drainage down his throat for about a week. The sensation of congestion is prominent, and his throat feels irritated due to the drainage. Although he has not developed a significant cough yet, he reports that when he gets these symptoms, the cough usually comes at the end.  No fever, chills, night sweats, or earaches. He mentions tenderness in the throat area and pressure in the ears, which started popping yesterday. When blowing his nose, the phlegm is mostly clear, with some color in the mornings.  He has a history of seasonal allergies, although they typically do not occur at this time of year. He is not currently using any nasal sprays and has not received a flu shot or COVID booster recently.     Outpatient Medications Prior to Visit  Medication Sig Dispense Refill   bisoprolol -hydrochlorothiazide  (ZIAC ) 5-6.25 MG tablet TAKE 1 TABLET BY MOUTH DAILY FOR BLOOD PRESSURE 30 tablet 3   buPROPion  (WELLBUTRIN  XL) 300 MG 24 hr tablet TAKE 1 TABLET BY MOUTH EVERY MORNING 30 tablet 0   busPIRone  (BUSPAR ) 5 MG tablet Take 1 tablet (5 mg total) by mouth 3 (three) times daily. 270 tablet 3   famotidine  (PEPCID ) 40 MG tablet Take  1 table t at Bedtime to Prevent Heartburn & Acid Indigestion                                 /                               TAKE                      BY MOUTH 90 tablet 3   FLUoxetine  (PROZAC ) 40 MG capsule Take 1 capsule (40 mg total) by mouth daily. 90 capsule 3   omeprazole  (PRILOSEC) 40 MG capsule TAKE 1 CAPSULE BY MOUTH DAILY TO PREVENT HEARTBURN AND INDIGESTION 90 capsule 3   rosuvastatin  (CRESTOR ) 5 MG tablet Take 1 tablet (5 mg total) by mouth daily. 90 tablet 3   sucralfate  (CARAFATE ) 1 g tablet Take 1 tablet (1 g total) by mouth at bedtime. 30 tablet 1   Vitamin D , Ergocalciferol , (DRISDOL ) 1.25 MG (50000 UNIT) CAPS capsule Take 1 capsule (50,000 Units total) by mouth every 7 (seven) days. 12 capsule 1   No facility-administered medications prior to visit.    ROS Review of Systems  Constitutional:  Negative for appetite change, chills, diaphoresis, fatigue and fever.  HENT:  Positive for congestion, ear pain, postnasal drip, rhinorrhea, sinus pressure, sinus pain and sore throat. Negative for ear discharge, facial swelling, sneezing and trouble swallowing.   Eyes:  Negative for  photophobia and visual disturbance.  Respiratory:  Positive for cough. Negative for chest tightness, shortness of breath and wheezing.   Cardiovascular:  Negative for chest pain, palpitations and leg swelling.  Gastrointestinal: Negative.  Negative for abdominal pain, diarrhea, nausea and vomiting.  Genitourinary: Negative.  Negative for difficulty urinating.  Musculoskeletal: Negative.   Skin: Negative.   Neurological:  Negative for dizziness and weakness.  Hematological:  Negative for adenopathy. Does not bruise/bleed easily.  Psychiatric/Behavioral: Negative.      Objective:  BP 128/82 (BP Location: Left Arm, Patient Position: Sitting, Cuff Size: Normal)   Pulse 76   Temp 98.4 F (36.9 C) (Oral)   Resp 16   Ht 6' (1.829 m)   Wt 223 lb (101.2 kg)   SpO2 99%   BMI 30.24 kg/m   BP Readings from Last 3 Encounters:  12/26/23 128/82  05/25/23 120/72  03/23/23 128/85    Wt Readings from Last 3  Encounters:  12/26/23 223 lb (101.2 kg)  05/25/23 230 lb 12.8 oz (104.7 kg)  03/23/23 228 lb 3.2 oz (103.5 kg)    Physical Exam Vitals reviewed.  Constitutional:      Appearance: Normal appearance.  HENT:     Right Ear: Hearing, tympanic membrane, ear canal and external ear normal.     Left Ear: Hearing, tympanic membrane, ear canal and external ear normal.     Nose: No mucosal edema, congestion or rhinorrhea.     Right Nostril: No epistaxis.     Left Nostril: No epistaxis.     Right Sinus: No maxillary sinus tenderness or frontal sinus tenderness.     Left Sinus: No maxillary sinus tenderness or frontal sinus tenderness.     Mouth/Throat:     Mouth: Mucous membranes are moist.     Pharynx: Oropharynx is clear. No pharyngeal swelling, oropharyngeal exudate or posterior oropharyngeal erythema.     Tonsils: No tonsillar exudate or tonsillar abscesses. 0 on the right. 0 on the left.  Eyes:     General: No scleral icterus.    Conjunctiva/sclera: Conjunctivae normal.  Cardiovascular:     Rate and Rhythm: Normal rate and regular rhythm.     Heart sounds: No murmur heard.    No friction rub. No gallop.  Pulmonary:     Effort: Pulmonary effort is normal.     Breath sounds: No stridor. No wheezing, rhonchi or rales.  Abdominal:     General: Abdomen is flat.     Palpations: There is no mass.     Tenderness: There is no abdominal tenderness. There is no guarding.     Hernia: No hernia is present.  Musculoskeletal:        General: Normal range of motion.     Right lower leg: No edema.     Left lower leg: No edema.  Lymphadenopathy:     Cervical: No cervical adenopathy.  Skin:    General: Skin is warm and dry.     Findings: No rash.  Neurological:     General: No focal deficit present.     Mental Status: He is alert.  Psychiatric:        Mood and Affect: Mood normal.        Behavior: Behavior normal.     Lab Results  Component Value Date   WBC 6.4 05/25/2023   HGB 14.3  05/25/2023   HCT 42.9 05/25/2023   PLT 238 05/25/2023   GLUCOSE 87 05/25/2023   CHOL 146 05/25/2023  TRIG 220 (H) 05/25/2023   HDL 43 05/25/2023   LDLCALC 71 05/25/2023   ALT 26 05/25/2023   AST 23 05/25/2023   NA 139 05/25/2023   K 4.0 05/25/2023   CL 103 05/25/2023   CREATININE 1.11 05/25/2023   BUN 9 05/25/2023   CO2 26 05/25/2023   TSH 0.902 05/25/2023   HGBA1C 5.1 07/26/2021   MICROALBUR <0.2 07/26/2021    No results found.  Assessment & Plan:  Eustachian tube dysfunction, bilateral -     methylPREDNISolone ; TAKE AS DIRECTED  Dispense: 21 tablet; Refill: 0  Acute laryngopharyngitis -     HYDROcodone  Bit-Homatrop MBr; Take 5 mLs by mouth every 8 (eight) hours as needed for up to 8 days for cough.  Dispense: 120 mL; Refill: 0  Acute non-recurrent maxillary sinusitis -     Amoxicillin -Pot Clavulanate; Take 1 tablet by mouth 2 (two) times daily for 10 days.  Dispense: 20 tablet; Refill: 0     Follow-up: No follow-ups on file.  Debby Molt, MD "

## 2023-12-26 NOTE — Patient Instructions (Signed)

## 2023-12-31 DIAGNOSIS — M9901 Segmental and somatic dysfunction of cervical region: Secondary | ICD-10-CM | POA: Diagnosis not present

## 2023-12-31 DIAGNOSIS — M62830 Muscle spasm of back: Secondary | ICD-10-CM | POA: Diagnosis not present

## 2023-12-31 DIAGNOSIS — M5412 Radiculopathy, cervical region: Secondary | ICD-10-CM | POA: Diagnosis not present

## 2023-12-31 DIAGNOSIS — S335XXA Sprain of ligaments of lumbar spine, initial encounter: Secondary | ICD-10-CM | POA: Diagnosis not present

## 2024-01-25 ENCOUNTER — Other Ambulatory Visit: Payer: Self-pay | Admitting: Internal Medicine

## 2024-01-25 DIAGNOSIS — F32A Depression, unspecified: Secondary | ICD-10-CM
